# Patient Record
Sex: Female | Born: 1970 | Race: Black or African American | Hispanic: No | Marital: Married | Smoking: Former smoker
Health system: Southern US, Community
[De-identification: ages and names within clinical notes are randomized; demographics above are authoritative.]

## PROBLEM LIST (undated history)

## (undated) DIAGNOSIS — E7800 Pure hypercholesterolemia, unspecified: Secondary | ICD-10-CM

## (undated) DIAGNOSIS — J189 Pneumonia, unspecified organism: Secondary | ICD-10-CM

## (undated) DIAGNOSIS — I10 Essential (primary) hypertension: Secondary | ICD-10-CM

## (undated) DIAGNOSIS — A599 Trichomoniasis, unspecified: Secondary | ICD-10-CM

## (undated) DIAGNOSIS — J45909 Unspecified asthma, uncomplicated: Secondary | ICD-10-CM

## (undated) HISTORY — DX: Pure hypercholesterolemia, unspecified: E78.00

## (undated) HISTORY — PX: CHOLECYSTECTOMY: SHX55

## (undated) HISTORY — DX: Essential (primary) hypertension: I10

## (undated) HISTORY — DX: Trichomoniasis, unspecified: A59.9

---

## 1999-10-29 ENCOUNTER — Emergency Department (HOSPITAL_COMMUNITY): Admission: EM | Admit: 1999-10-29 | Discharge: 1999-10-29 | Payer: Self-pay | Admitting: Emergency Medicine

## 2000-04-05 ENCOUNTER — Encounter: Payer: Self-pay | Admitting: Obstetrics and Gynecology

## 2000-04-05 ENCOUNTER — Ambulatory Visit (HOSPITAL_COMMUNITY): Admission: RE | Admit: 2000-04-05 | Discharge: 2000-04-05 | Payer: Self-pay | Admitting: Obstetrics and Gynecology

## 2001-01-22 ENCOUNTER — Emergency Department (HOSPITAL_COMMUNITY): Admission: EM | Admit: 2001-01-22 | Discharge: 2001-01-22 | Payer: Self-pay | Admitting: Emergency Medicine

## 2001-03-29 ENCOUNTER — Emergency Department (HOSPITAL_COMMUNITY): Admission: EM | Admit: 2001-03-29 | Discharge: 2001-03-29 | Payer: Self-pay | Admitting: Emergency Medicine

## 2002-08-12 ENCOUNTER — Other Ambulatory Visit: Admission: RE | Admit: 2002-08-12 | Discharge: 2002-08-12 | Payer: Self-pay | Admitting: Obstetrics and Gynecology

## 2005-10-11 ENCOUNTER — Inpatient Hospital Stay (HOSPITAL_COMMUNITY): Admission: AD | Admit: 2005-10-11 | Discharge: 2005-12-28 | Payer: Self-pay | Admitting: Obstetrics and Gynecology

## 2005-10-20 ENCOUNTER — Ambulatory Visit: Payer: Self-pay | Admitting: Neonatology

## 2006-01-02 ENCOUNTER — Inpatient Hospital Stay (HOSPITAL_COMMUNITY): Admission: AD | Admit: 2006-01-02 | Discharge: 2006-01-20 | Payer: Self-pay | Admitting: Obstetrics and Gynecology

## 2006-01-17 ENCOUNTER — Encounter (INDEPENDENT_AMBULATORY_CARE_PROVIDER_SITE_OTHER): Payer: Self-pay | Admitting: *Deleted

## 2006-09-02 ENCOUNTER — Emergency Department (HOSPITAL_COMMUNITY): Admission: EM | Admit: 2006-09-02 | Discharge: 2006-09-02 | Payer: Self-pay | Admitting: Family Medicine

## 2006-11-26 ENCOUNTER — Emergency Department (HOSPITAL_COMMUNITY): Admission: EM | Admit: 2006-11-26 | Discharge: 2006-11-26 | Payer: Self-pay | Admitting: Family Medicine

## 2006-11-27 ENCOUNTER — Ambulatory Visit (HOSPITAL_COMMUNITY): Admission: RE | Admit: 2006-11-27 | Discharge: 2006-11-27 | Payer: Self-pay | Admitting: Family Medicine

## 2007-02-14 ENCOUNTER — Ambulatory Visit (HOSPITAL_COMMUNITY): Admission: RE | Admit: 2007-02-14 | Discharge: 2007-02-15 | Payer: Self-pay | Admitting: General Surgery

## 2007-02-14 ENCOUNTER — Encounter (INDEPENDENT_AMBULATORY_CARE_PROVIDER_SITE_OTHER): Payer: Self-pay | Admitting: *Deleted

## 2007-03-19 ENCOUNTER — Emergency Department (HOSPITAL_COMMUNITY): Admission: EM | Admit: 2007-03-19 | Discharge: 2007-03-19 | Payer: Self-pay | Admitting: Family Medicine

## 2007-06-10 IMAGING — US US OB FOLLOW-UP
1 series · 13 of 28 positions shown · non-contrast
Comparison: none

CLINICAL DATA: Pre-term labor.  Assess cervical length and estimated fetal weight.

[Series 1: us ob follow-up · 40 acquisitions, 13 frames shown]
[im 2/40]
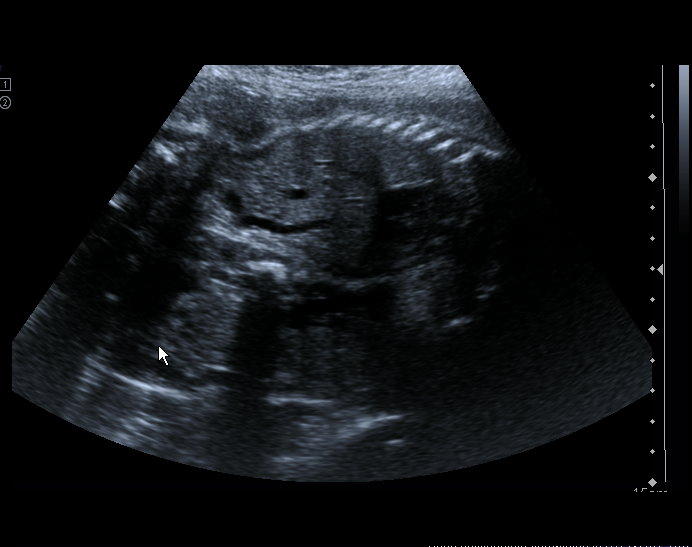
[im 5/40]
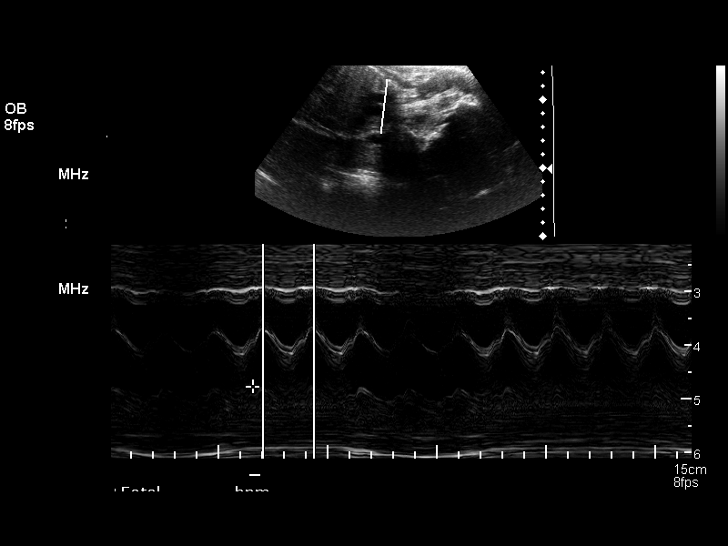
[im 8/40]
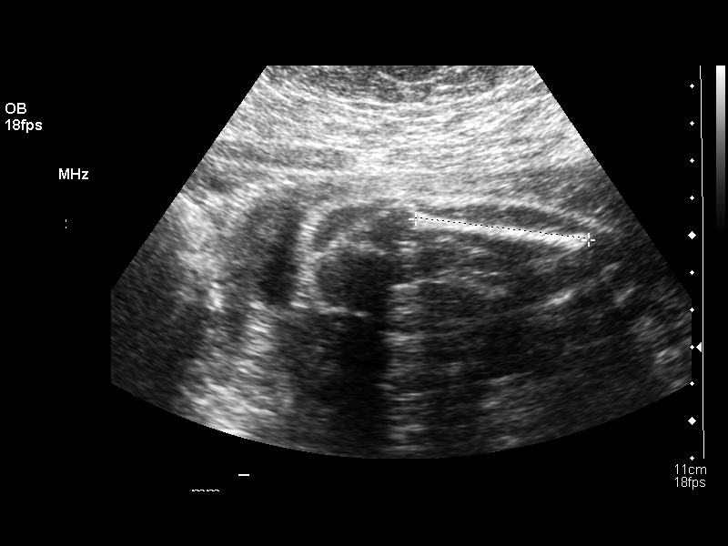
[im 11/40]
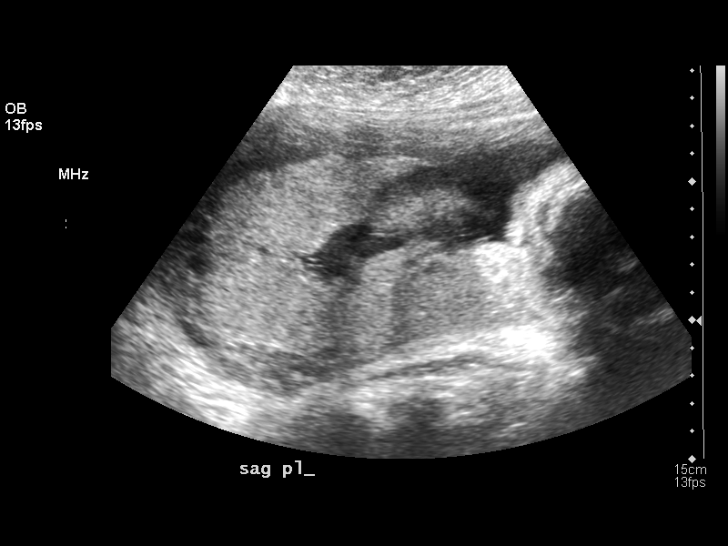
[im 14/40]
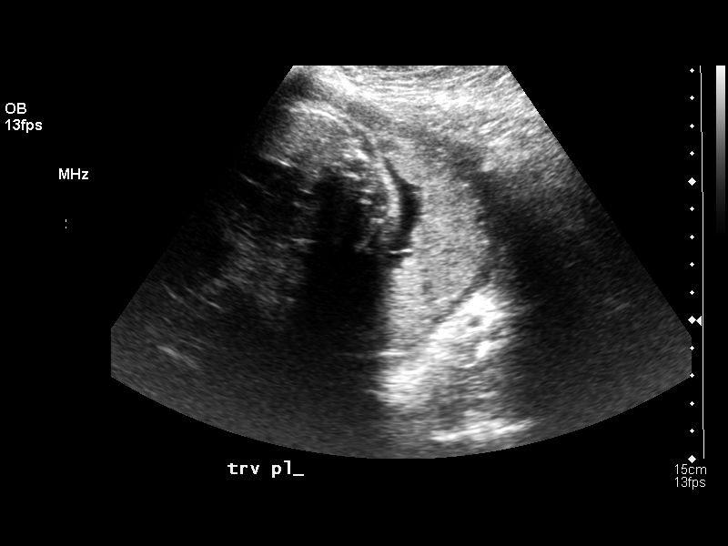
[im 16/40]
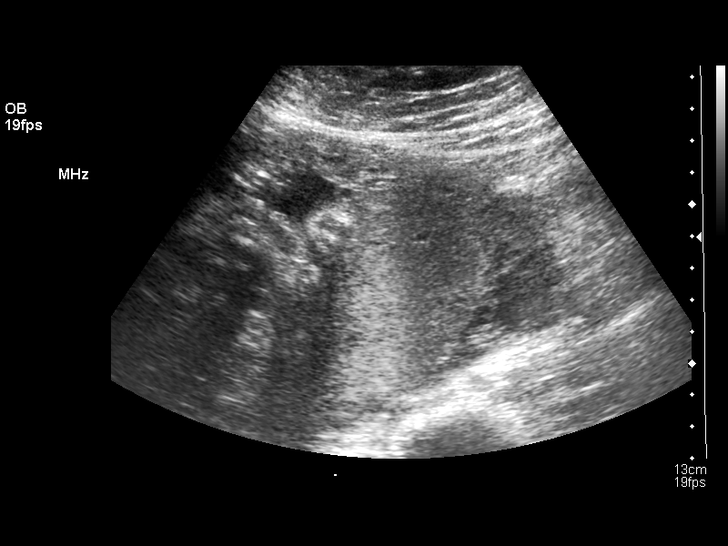
[im 21/40]
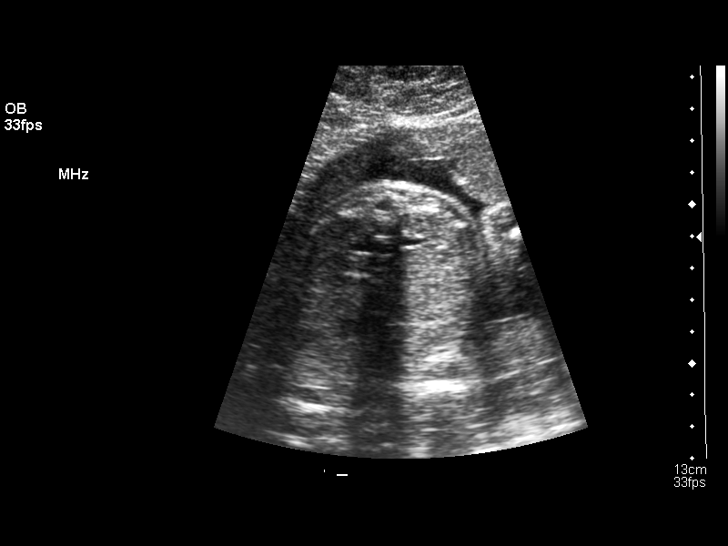
[im 24/40]
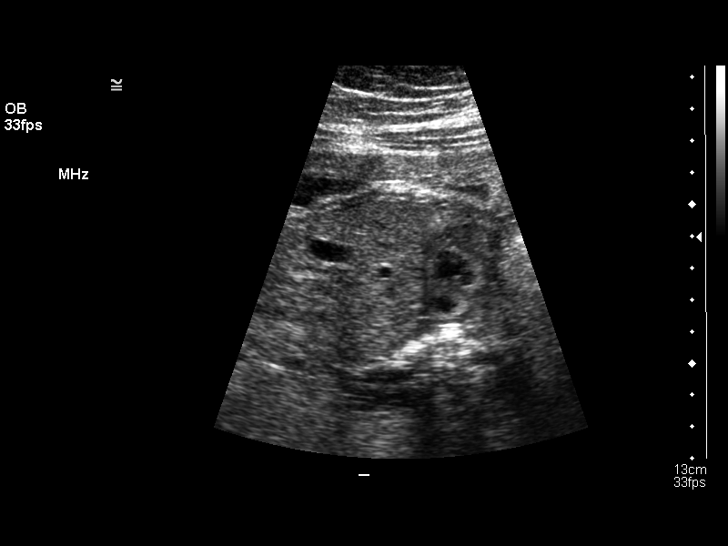
[im 27/40]
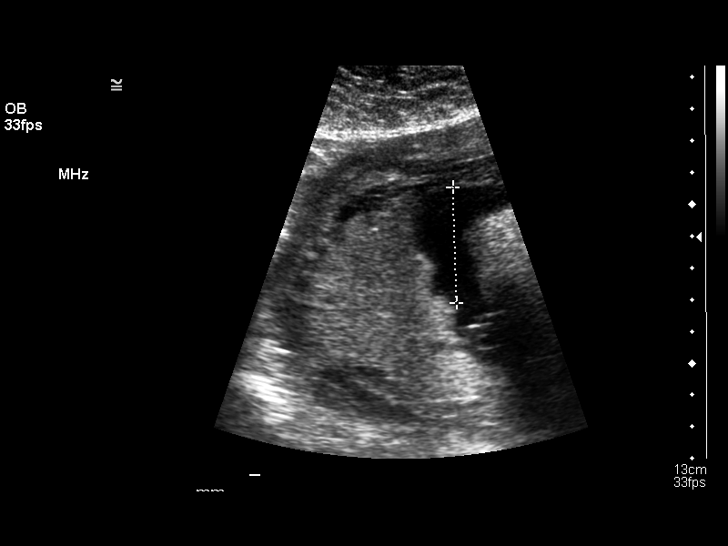
[im 29/40]
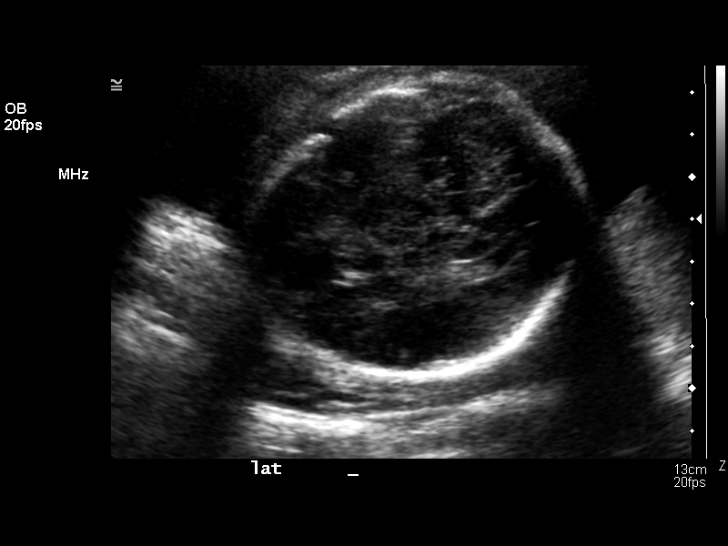
[im 32/40]
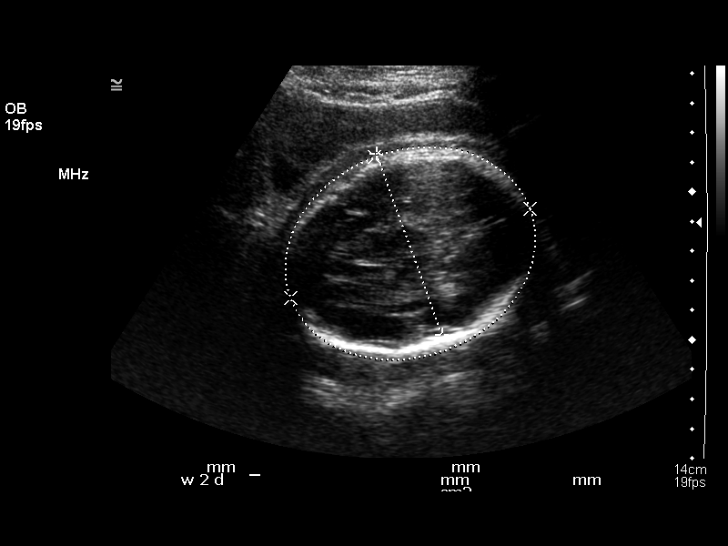
[im 35/40]
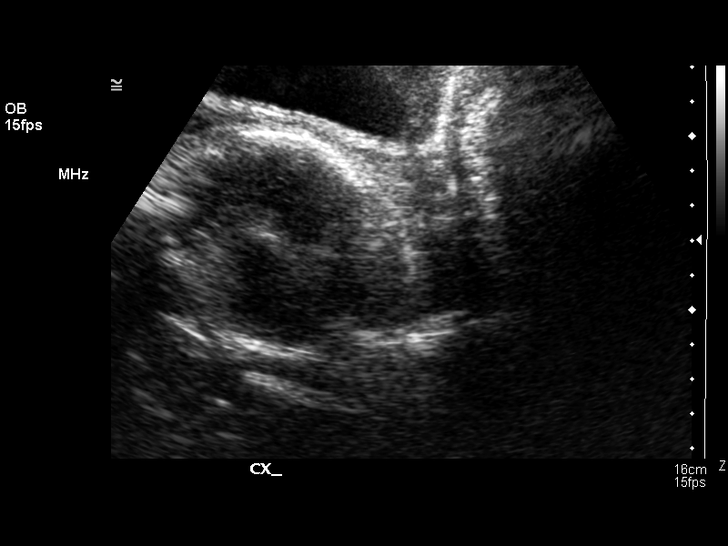
[im 38/40]
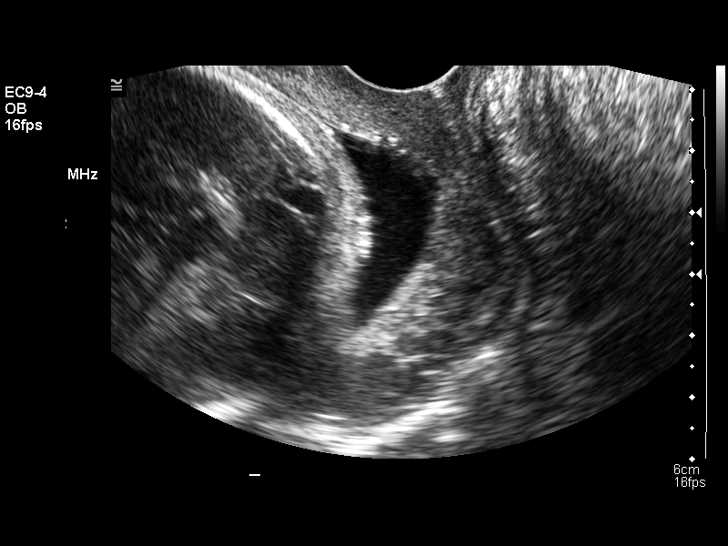

[13 of 28 positions shown; findings below may reference images not displayed]

OBSTETRICAL ULTRASOUND RE-EVALUATION AND TRANSVAGINAL OB US:
Number of Fetuses: 1
Heart Rate:  133
Movement:  Yes
Breathing:  Yes
Presentation:  Cephalic
Placental Location:  Fundal, posterior, left lateral 
Grade:  I
Previa:  No
Amniotic Fluid (subjective):  Normal
Amniotic Fluid (objective):  11.8 cm AFI (5th -95th%ile = 9.5 ? 22.6 cm for 27 wks)

FETAL BIOMETRY
BPD:  6.8 cm   27 w 2 d
HC:  24.8 cm  26 w 6 d
AC:  21.9 cm   26 w 2 d
FL:  4.9 cm  26 w 5 d

Mean GA:  26 w 6 d
Assigned GA:  26 w 6 d  Assigned EDC:  02/14/06

EFW:  951 g (H) 50th ? 75th%ile (875 ? 5854 g) For 27 wks

FETAL ANATOMY
Lateral Ventricles:  Visualized 
Thalami/CSP:  Previously seen 
Posterior Fossa:  Previously seen 
Nuchal Region:  N/A
Spine:  Previously seen 
4 Chamber Heart on Left:  Visualized 
Stomach on Left:  Visualized 
3 Vessel Cord:  2-vessel cord noted
Cord Insertion Site:  Previously seen 
Kidneys:  Visualized 
Bladder:  Visualized  
Extremities:  Previously seen 
MATERNAL UTERINE AND ADNEXAL FINDINGS
Cervix:  6 mm Transvaginally

BIOPHYSICAL PROFILE

Movement:  2    Time:  15 minutes
Breathing:  2
Tone:  2
Amniotic Fluid:  2

Total Score:  8
IMPRESSION: 1.  Single intrauterine pregnancy demonstrating an estimated gestational age by ultrasound of 26 weeks and 6 days.  Correlation with assigned gestational age of 26 weeks and 6 days suggests appropriate growth.  Currently the estimated fetal weight is just above the 50th percentile for a 27 week gestation.  
2.  Subjectively and quantitatively normal amniotic fluid volume.
3.  Biophysical profile score [DATE].
4.  Shortened cervical length today measuring 6 mm.  This has not changed significantly since the previous exam of 11/06/05 at which time it measured 8 mm.
5.  No late developing fetal anatomic abnormalities are identified associated with the lateral ventricles, four chamber heart, stomach, kidneys or bladder.  Again noted is the presence of a 2 vessel cord.

## 2007-06-28 IMAGING — US US OB TRANSVAGINAL MODIFY
1 series · 13 of 28 positions shown · non-contrast
Comparison: 11/24/05.

CLINICAL DATA: Preterm labor.  Decelerations.  

BIOPHYSICAL PROFILE AND TRANSVAGINAL OB US:

[Series 1: us ob transvaginal modify · 0.24mm/px · 13 of 41 slices shown]
[im 2/41]
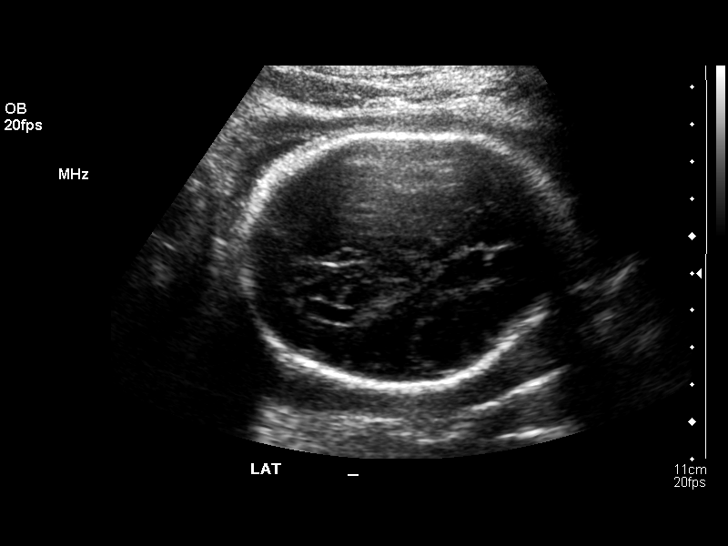
[im 5/41]
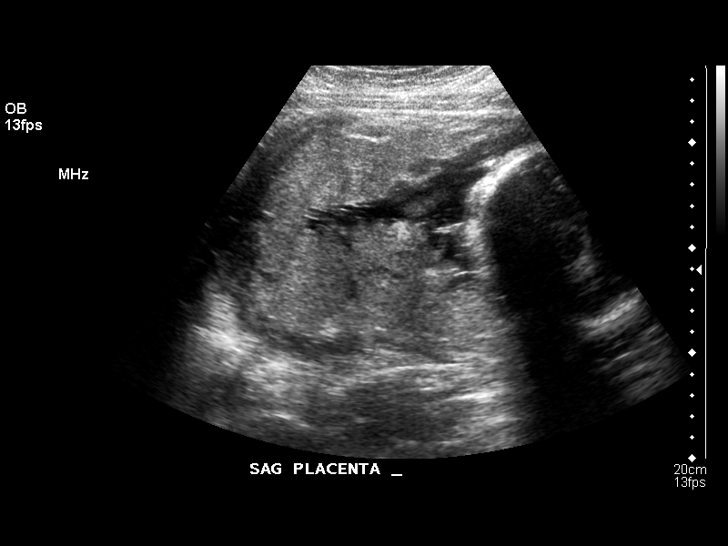
[im 8/41]
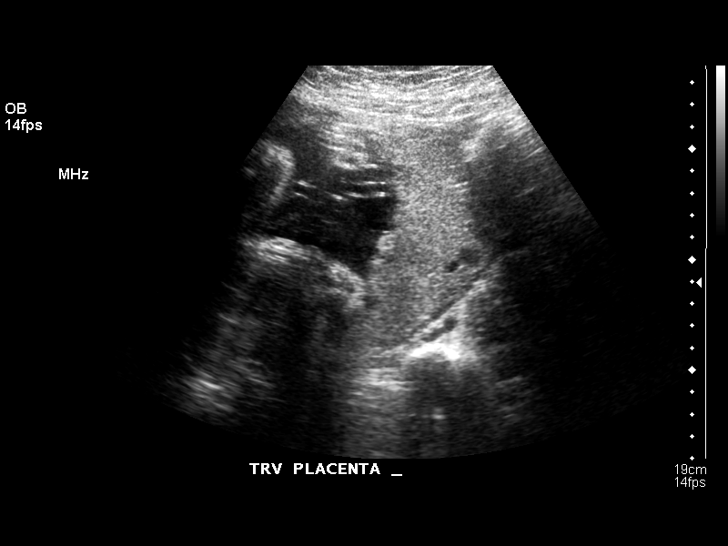
[im 11/41]
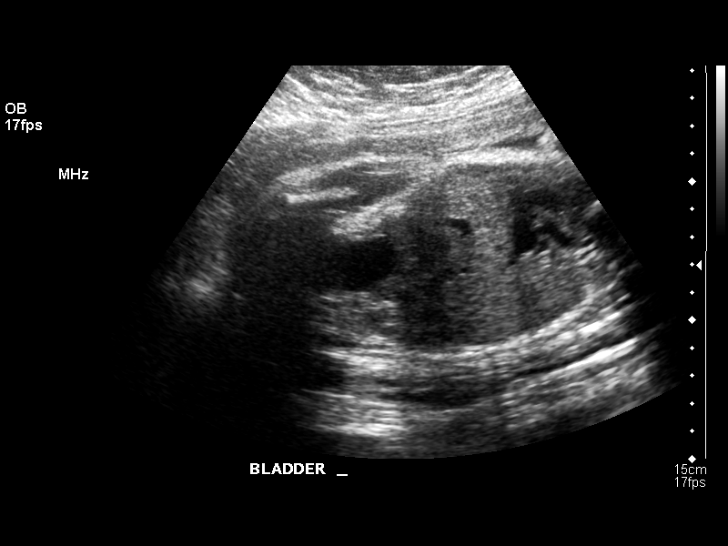
[im 14/41]
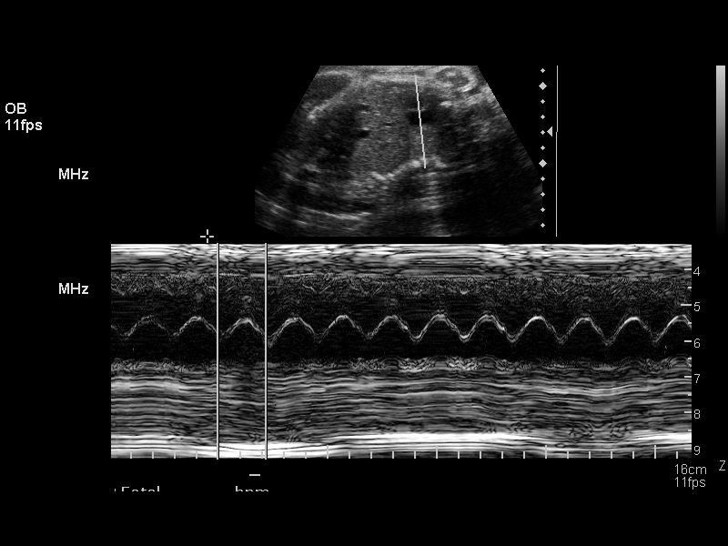
[im 17/41]
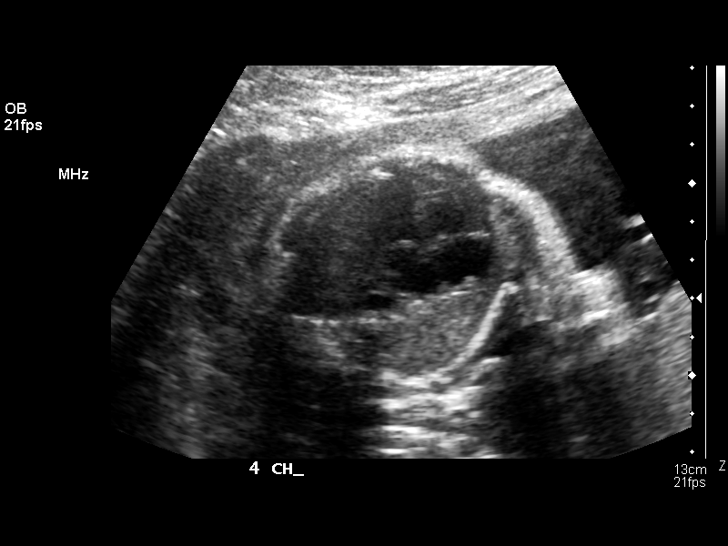
[im 21/41]
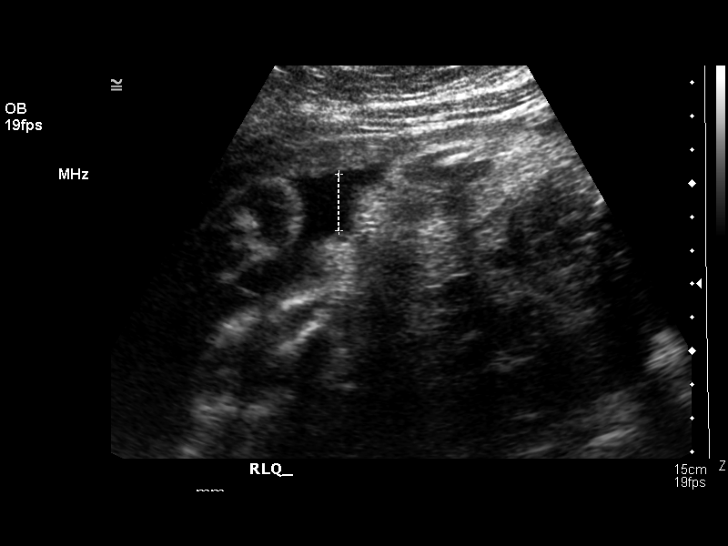
[im 24/41]
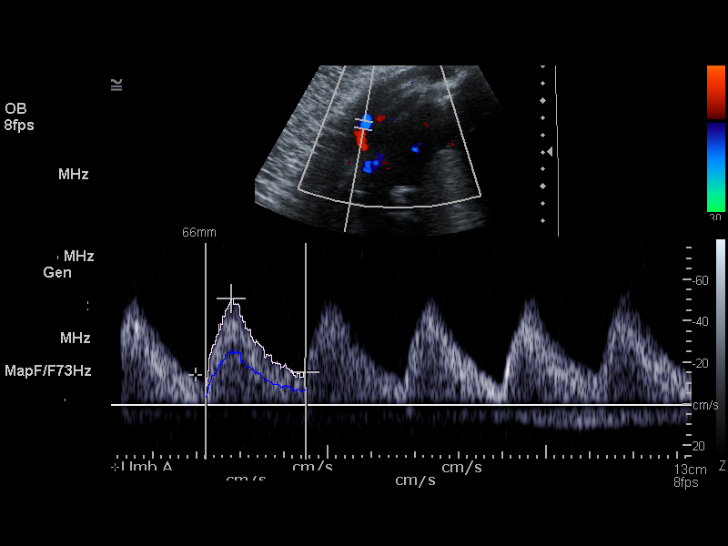
[im 27/41]
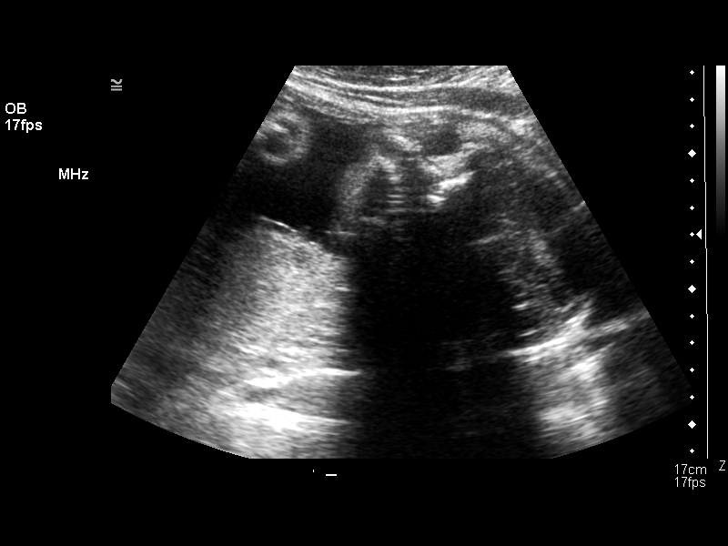
[im 30/41]
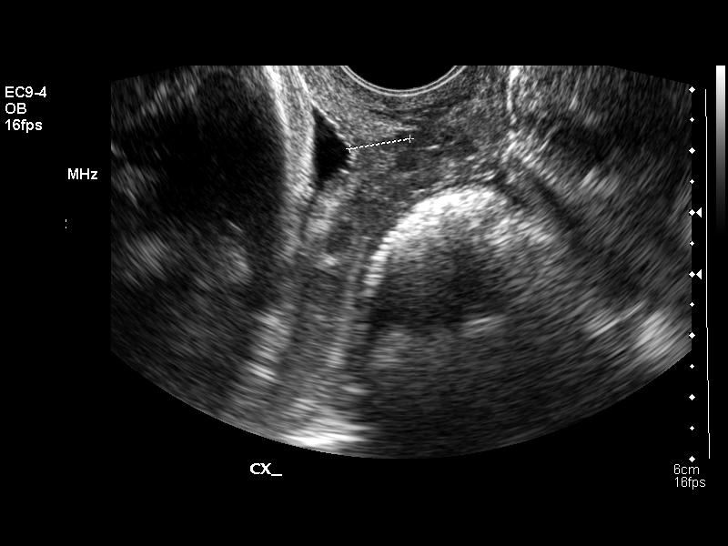
[im 33/41]
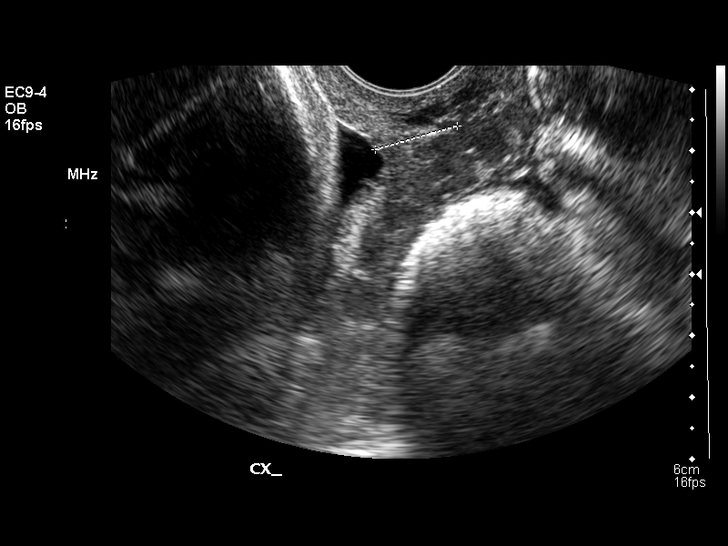
[im 36/41]
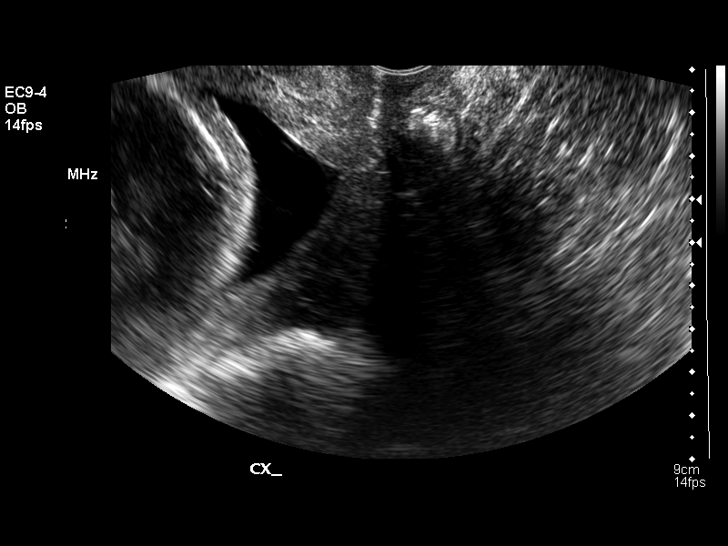
[im 39/41]
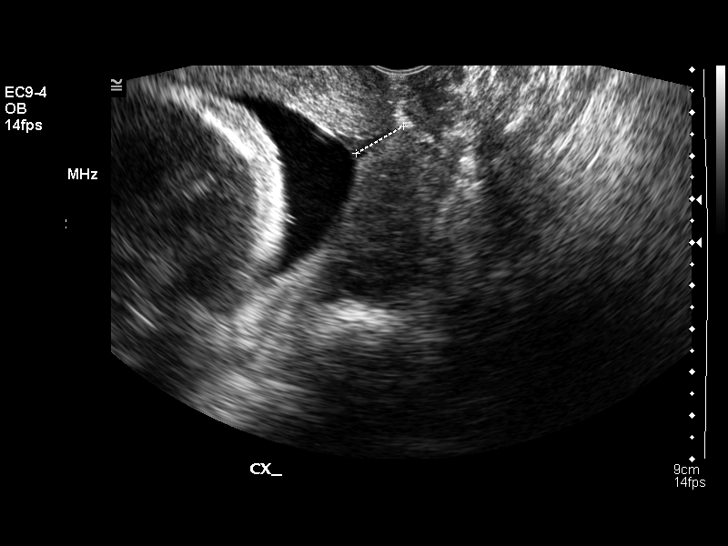

[13 of 28 positions shown; findings below may reference images not displayed]

Number of Fetuses:  1
Heart rate:  136
Movement:  Yes
Breathing:  Yes
Presentation:  Cephalic
Placental Location:  Fundal, posterior, and left lateral
Grade:  I
Previa:  No
Amniotic Fluid (Subjective):  Normal
Amniotic Fluid (Objective):  11.4 cm AFI (5th -95th%ile = 9.2 ? 23.1 cm for 29 wks)

Fetal measurements and complete anatomic evaluation were not requested.  The following fetal anatomy was visualized on this exam:  Lateral ventricles, thalami, four chamber heart, stomach, kidneys, bladder, LVOT, orbits and diaphragm.  2-vessel cord previously noted. 

BPP SCORING
Movements:  2  Time:  30 minutes
Breathing:  2
Tone:  2
Amniotic Fluid:  2
Total Score:  8

MATERNAL UTERINE AND ADNEXAL FINDINGS
Cervix:  1.3 cm Transvaginally again noted.

DOPPLER ULTRASOUND OF FETUS:

Umbilical Artery S/D Ratio:  3.40    (NL< 4.35)
IMPRESSION: 1.  Single live intrauterine gestation in cephalic presentation.  
2.  Normal umbilical cord Doppler.  
3.  Biophysical profile [DATE] over 30 minutes.  
4.  2-vessel cord again incidentally noted.    

5. Cervical shortening (1.3 cm) again noted.

## 2007-07-02 IMAGING — US US UA DOPPLER RE-EVAL
1 series · 13 of 28 positions shown · non-contrast
Comparison: none

CLINICAL DATA: Fetal decels; preterm labor; assigned gestational age is 30 weeks.

[Series 1: us ua doppler re-eval · 0.31mm/px · 13 of 34 slices shown]
[im 2/34]
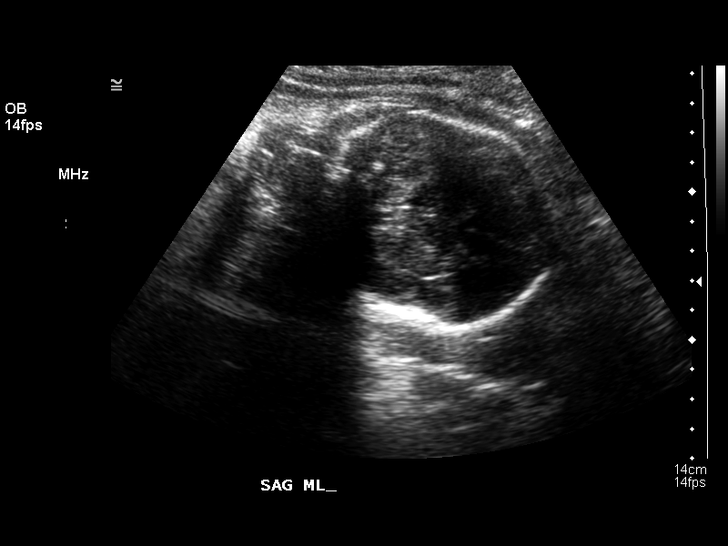
[im 4/34]
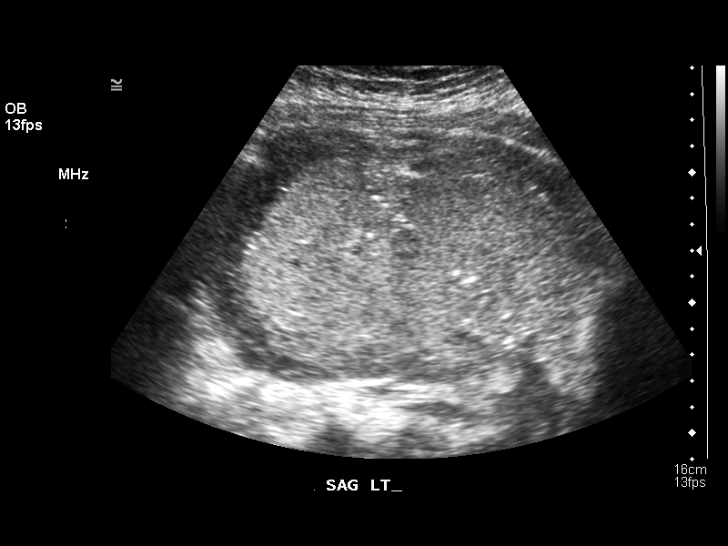
[im 7/34]
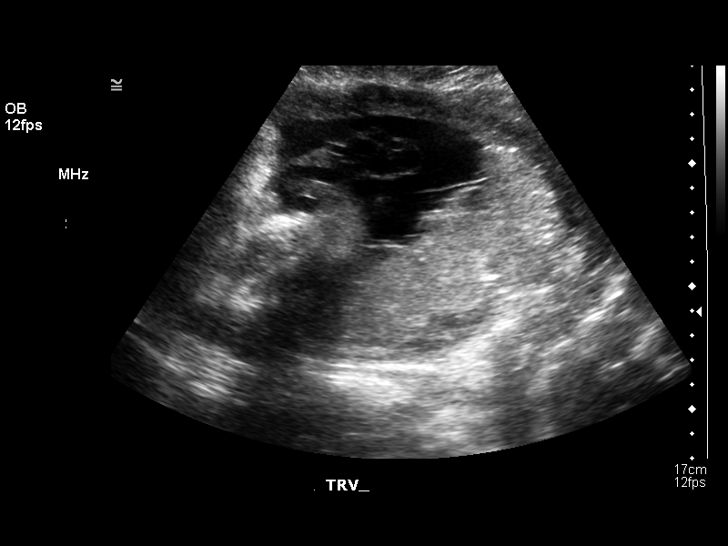
[im 9/34]
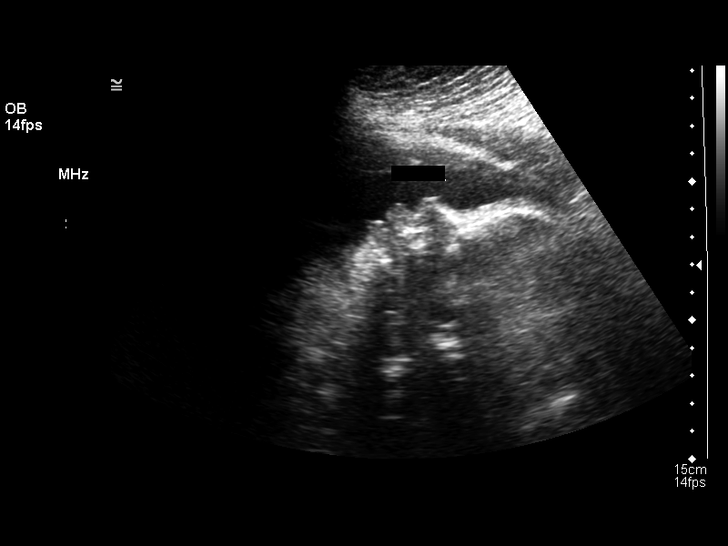
[im 12/34]
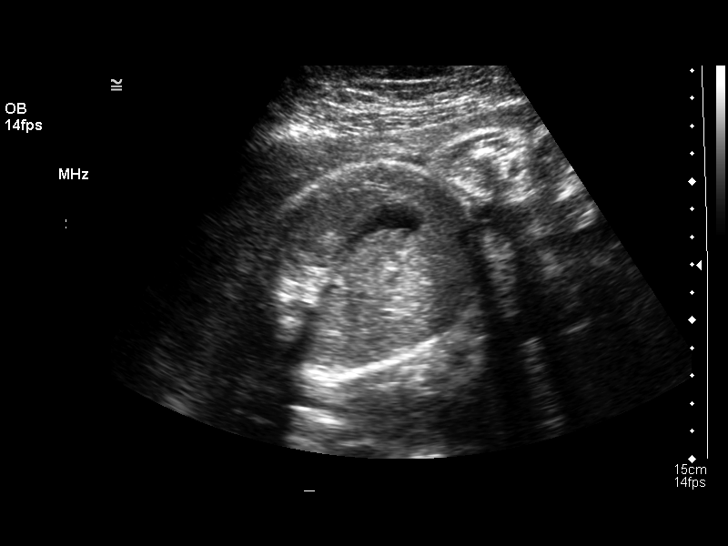
[im 14/34]
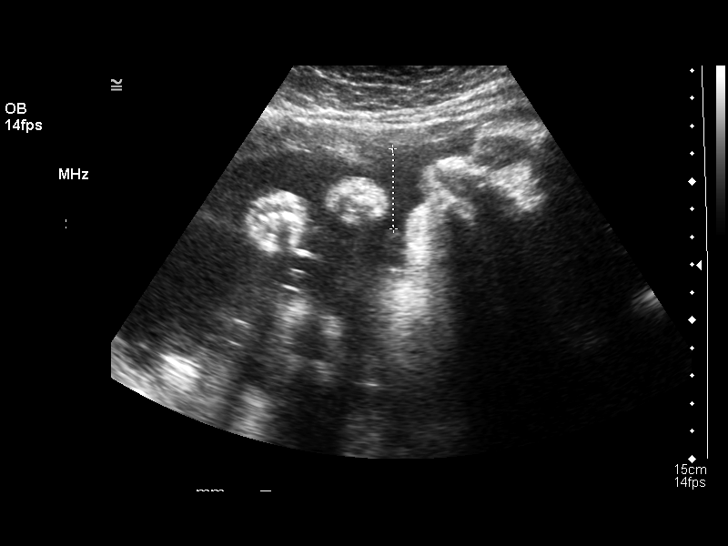
[im 18/34]
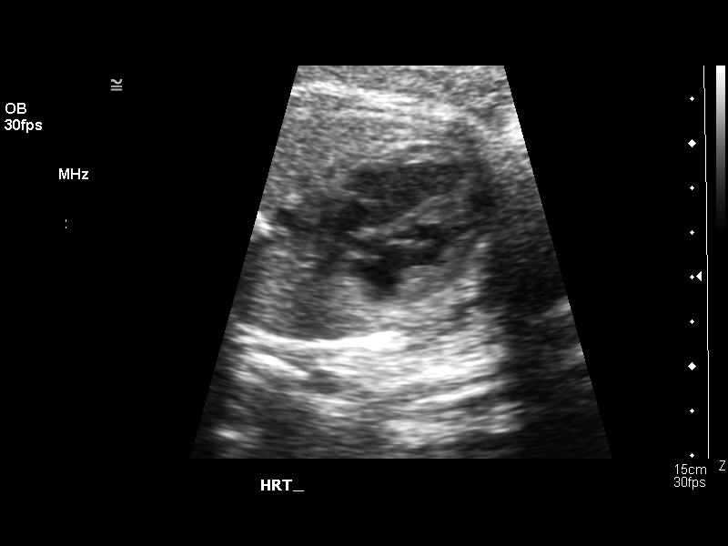
[im 20/34]
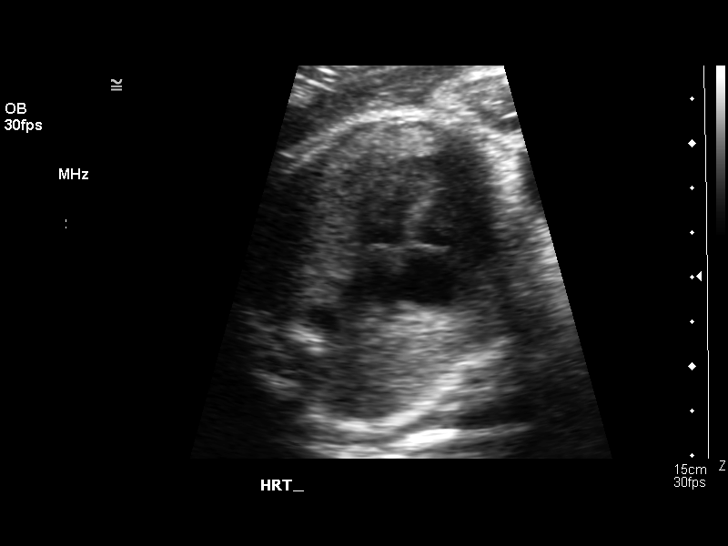
[im 23/34]
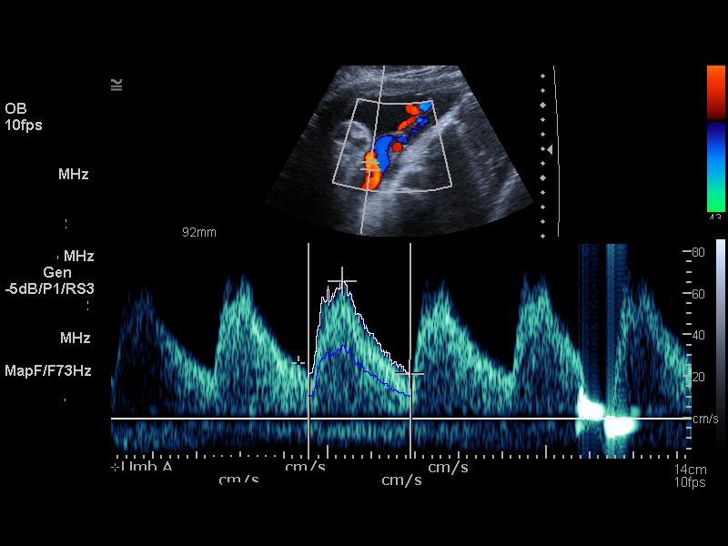
[im 25/34]
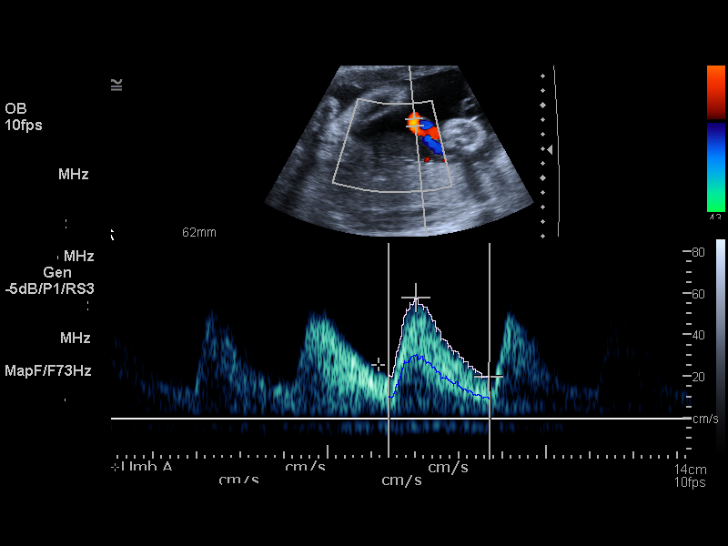
[im 27/34]
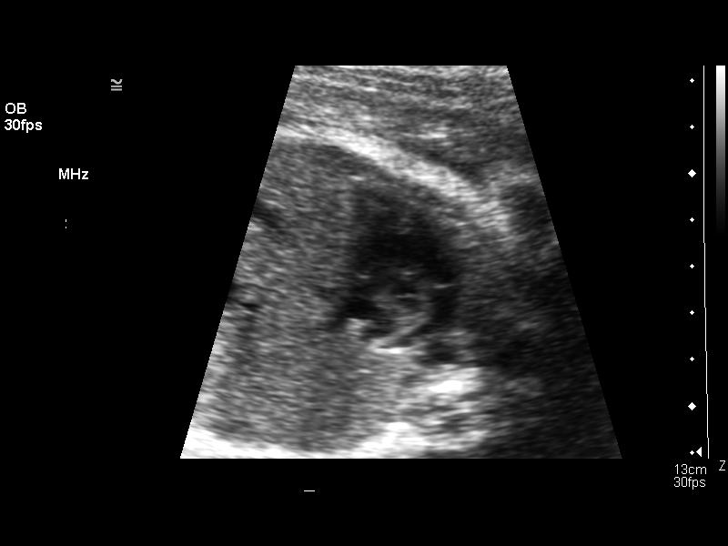
[im 30/34]
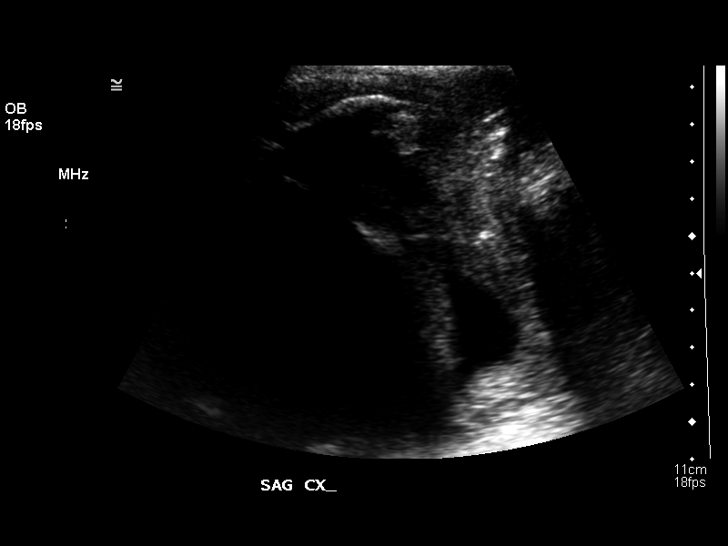
[im 32/34]
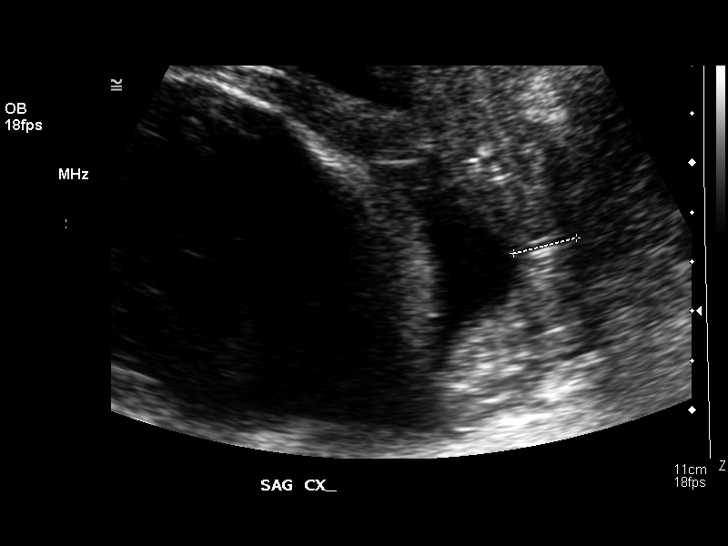

[13 of 28 positions shown; findings below may reference images not displayed]

BIOPHYSICAL PROFILE

Number of Fetuses:  1
Heart rate:  136
Movement:  Yes
Breathing:  Yes
Presentation:  Cephalic
Placental Location:  Fundal, posterior, and left lateral 
Grade:  I
Previa:  No
Amniotic Fluid (Subjective):  Normal
Amniotic Fluid (Objective):  11.4 cm AFI (5th -95th%ile = 9.2 ? 23.1 cm for 29 wks)

Fetal measurements and complete anatomic evaluation were not requested.  The following fetal anatomy was visualized on this exam:  Lateral ventricles, thalami, four chamber heart, stomach, kidneys, bladder, LVOT, orbits, and diaphragm.

BPP SCORING
Movements:  2  Time:  30 minutes
Breathing:  2
Tone:  2
Amniotic Fluid:  2
Total Score:  8

MATERNAL UTERINE AND ADNEXAL FINDINGS
Cervix:  1.3 cm Transvaginally

DOPPLER ULTRASOUND OF FETUS:

Umbilical Artery S/D Ratio:  3.40    (NL< 4.35)
IMPRESSION: 1.  There is a single living intrauterine gestation in cephalic presentation.  The biophysical score is [DATE] over a 20 minute period.  The umbilical artery Doppler examination is normal. 
2.  The cervix remains shortened, measuring approximately 1.2 cm, translabially.  The internal os is dilated to approximately 2.8 cm in the anterior/posterior dimension.

## 2007-07-05 ENCOUNTER — Emergency Department (HOSPITAL_COMMUNITY): Admission: EM | Admit: 2007-07-05 | Discharge: 2007-07-05 | Payer: Self-pay | Admitting: Emergency Medicine

## 2007-07-05 IMAGING — US US FETAL BPP W/O NONSTRESS
1 series · 14 of 28 positions shown · non-contrast
Comparison: none

CLINICAL DATA: 30 week 3 day assigned gestational age.  Known 2-vessel umbilical cord.  Preterm labor.  Evaluate BPP and umbilical artery Doppler.

[Series 1: us fetal bpp w/o nonstress · 14 of 28 slices shown]
[im 2/28]
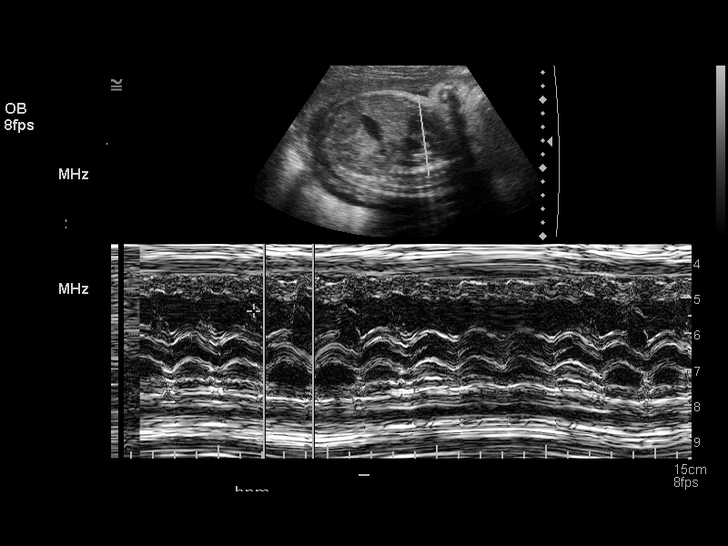
[im 4/28]
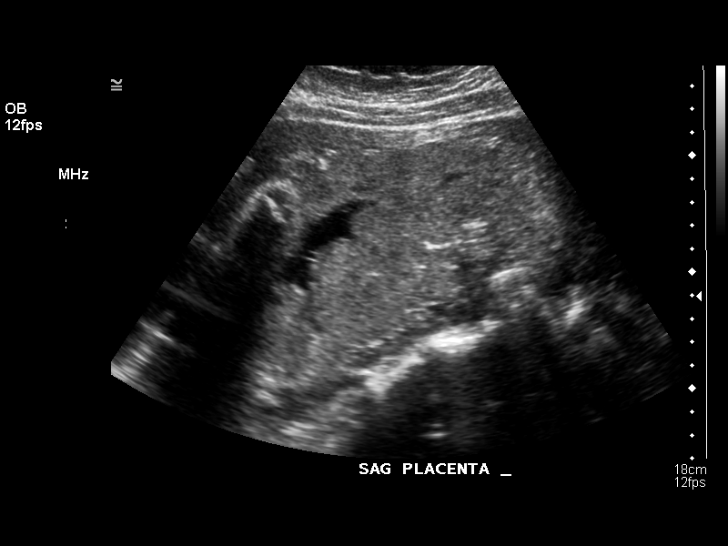
[im 6/28]
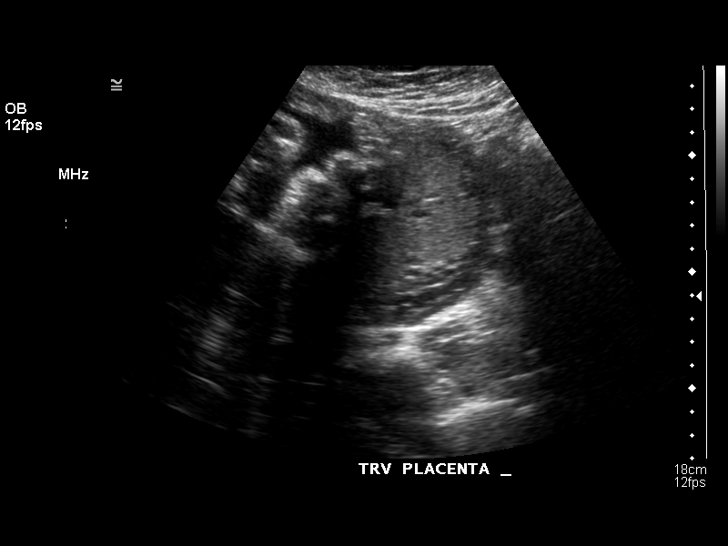
[im 8/28]
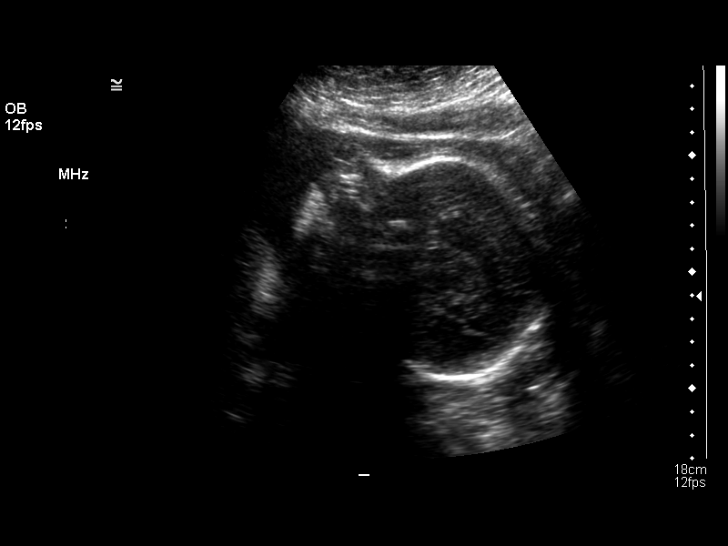
[im 10/28]
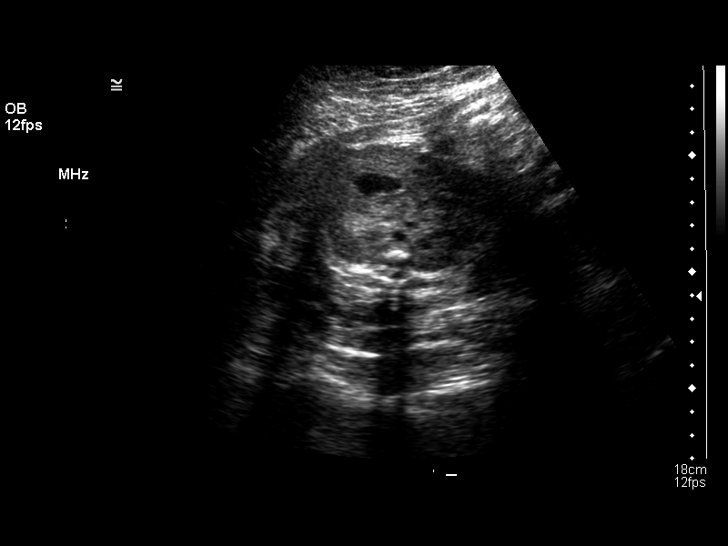
[im 12/28]
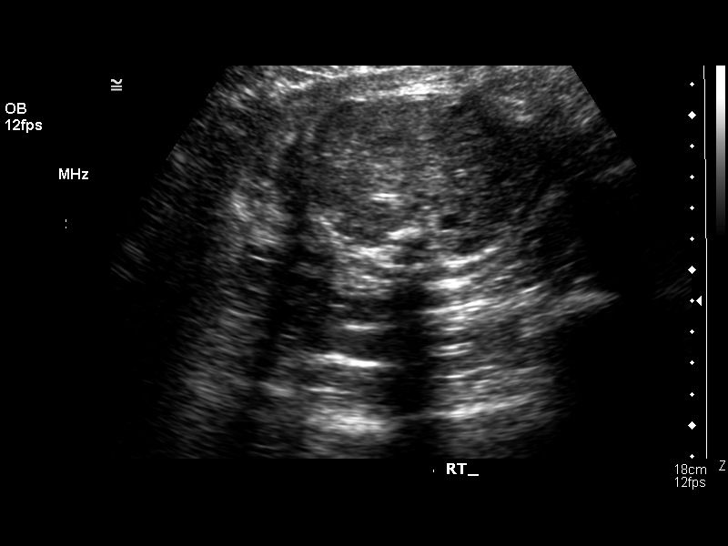
[im 14/28]
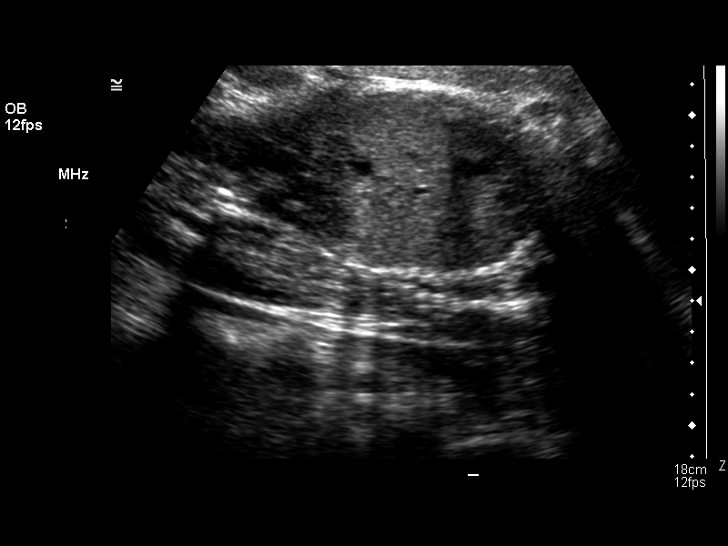
[im 16/28]
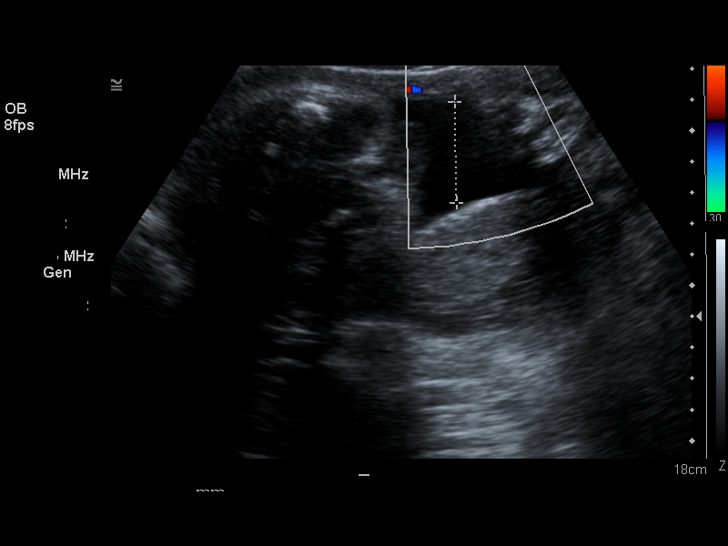
[im 18/28]
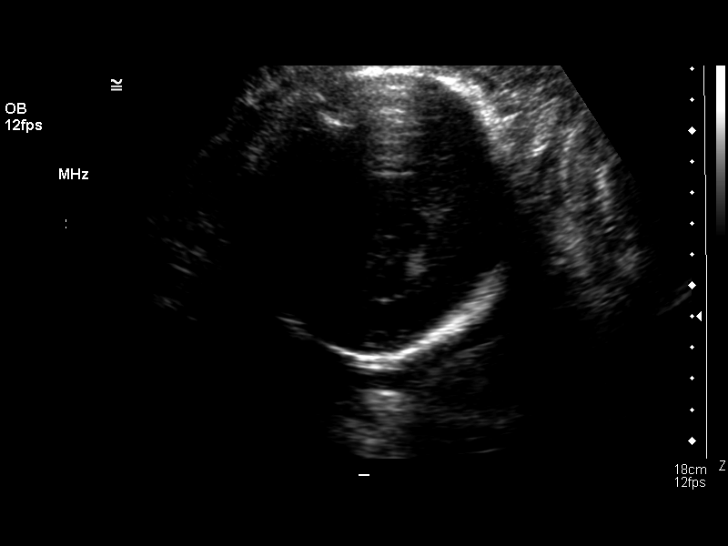
[im 20/28]
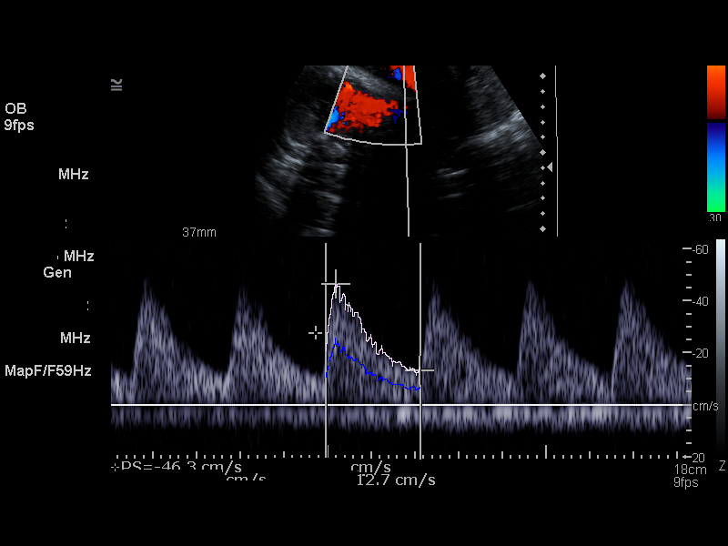
[im 22/28]
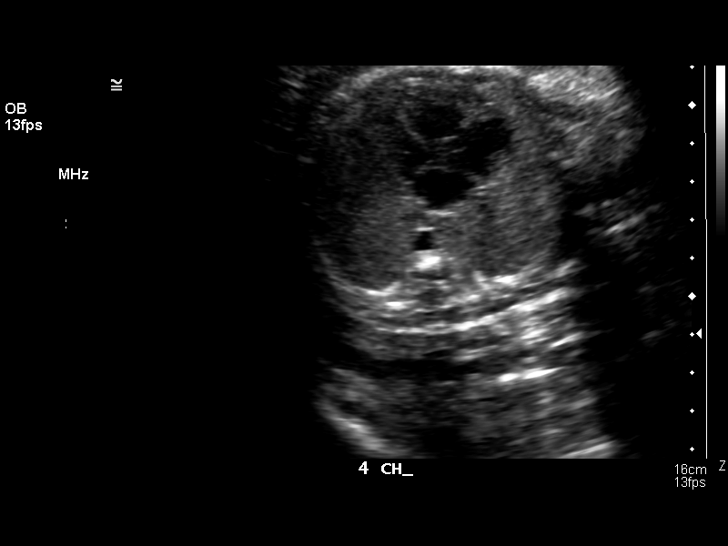
[im 24/28]
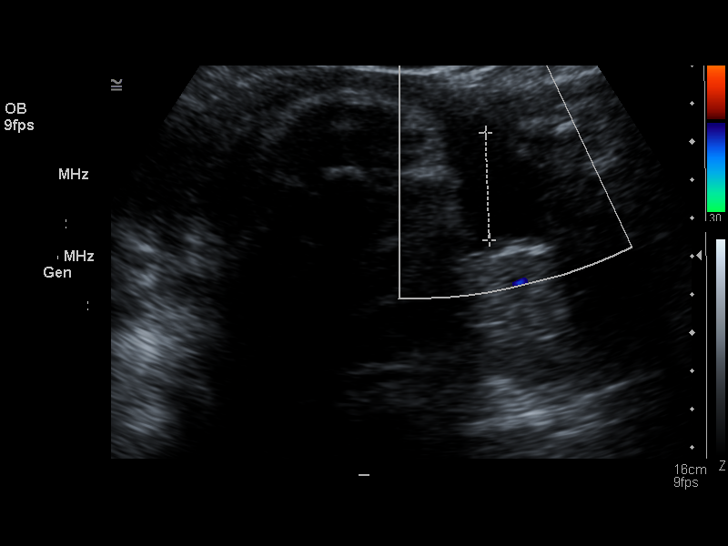
[im 26/28]
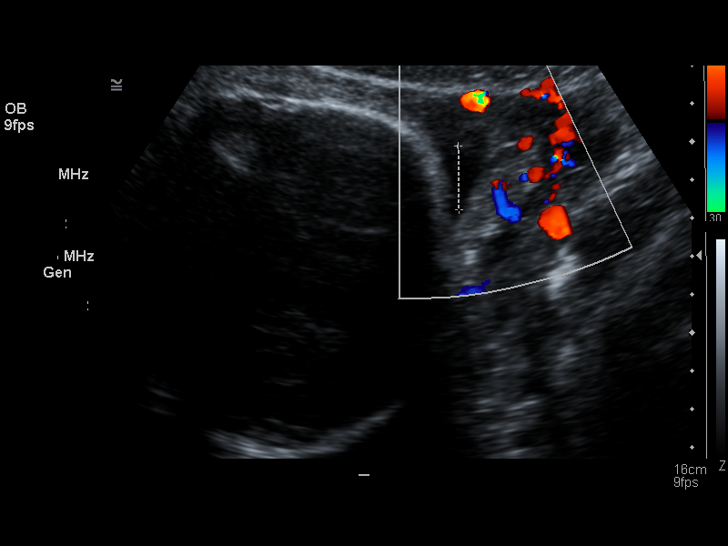
[im 28/28]
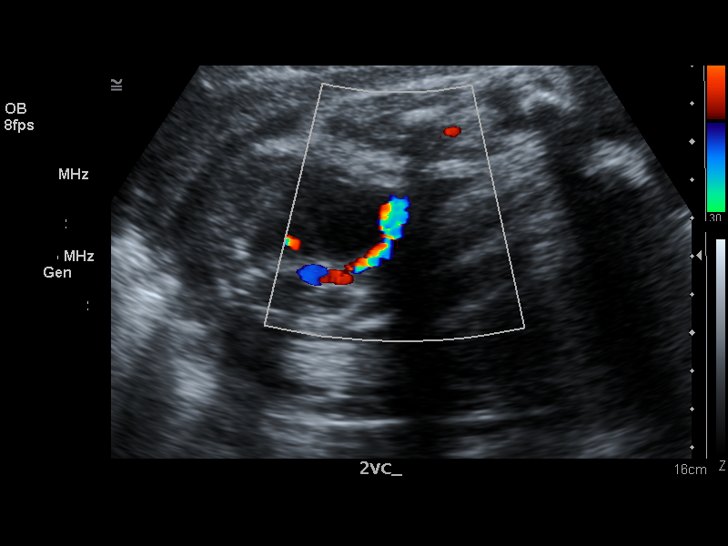

[14 of 28 positions shown; findings below may reference images not displayed]

BIOPHYSICAL PROFILE

 Number of Fetuses:  1
 Heart rate:  133
 Movement:  Yes
 Breathing:  No
 Presentation:  Cephalic
 Placental Location:  Posterior, left lateral 
 Grade:  II
 Previa:  No
 Amniotic Fluid (Subjective):  Low normal
 Amniotic Fluid (Objective):  10.1 cm AFI (5th -95th%ile = 9.0 ? 23.4 cm for 30 wks)

 Fetal measurements and complete anatomic evaluation were not requested.  The following fetal anatomy was visualized on this exam:  Four chamber heart, stomach, 2-vessel cord, kidneys, bladder, diaphragm and male genitalia.

 BPP SCORING 
 Movements:  2  Time:  30 minutes
 Breathing:  0
 Tone:  2
 Amniotic Fluid:  2
 Total Score:  6

 MATERNAL UTERINE AND ADNEXAL FINDINGS
 Cervix:  Not evaluated.
IMPRESSION: 1.  Biophysical profile score [DATE].
 2.  Low normal amniotic fluid volume noted, with AFI of 10.1 cm on today?s exam.
 DOPPLER ULTRASOUND OF FETUS:

 Umbilical Artery S/D Ratio:  3.37    (NL< 4.17)
IMPRESSION: Umbilical artery S/D ratio is within normal limits for gestational age.

## 2008-09-22 ENCOUNTER — Encounter: Admission: RE | Admit: 2008-09-22 | Discharge: 2008-10-06 | Payer: Self-pay | Admitting: Emergency Medicine

## 2008-10-12 ENCOUNTER — Emergency Department (HOSPITAL_COMMUNITY): Admission: EM | Admit: 2008-10-12 | Discharge: 2008-10-12 | Payer: Self-pay | Admitting: Emergency Medicine

## 2009-09-03 ENCOUNTER — Other Ambulatory Visit: Admission: RE | Admit: 2009-09-03 | Discharge: 2009-09-03 | Payer: Self-pay | Admitting: Gynecology

## 2009-09-03 ENCOUNTER — Encounter: Payer: Self-pay | Admitting: Gynecology

## 2009-09-03 ENCOUNTER — Ambulatory Visit: Payer: Self-pay | Admitting: Gynecology

## 2009-09-14 ENCOUNTER — Ambulatory Visit: Payer: Self-pay | Admitting: Gynecology

## 2010-03-20 ENCOUNTER — Emergency Department (HOSPITAL_COMMUNITY): Admission: EM | Admit: 2010-03-20 | Discharge: 2010-03-21 | Payer: Self-pay | Admitting: Emergency Medicine

## 2010-12-08 ENCOUNTER — Other Ambulatory Visit
Admission: RE | Admit: 2010-12-08 | Discharge: 2010-12-08 | Payer: Self-pay | Source: Home / Self Care | Admitting: Gynecology

## 2010-12-08 ENCOUNTER — Ambulatory Visit
Admission: RE | Admit: 2010-12-08 | Discharge: 2010-12-08 | Payer: Self-pay | Source: Home / Self Care | Attending: Gynecology | Admitting: Gynecology

## 2010-12-26 ENCOUNTER — Encounter: Payer: Self-pay | Admitting: Obstetrics and Gynecology

## 2011-04-14 ENCOUNTER — Emergency Department (HOSPITAL_COMMUNITY)
Admission: EM | Admit: 2011-04-14 | Discharge: 2011-04-14 | Disposition: A | Discharge status: Home / Self Care | Payer: No Typology Code available for payment source | Attending: Emergency Medicine | Admitting: Emergency Medicine

## 2011-04-14 DIAGNOSIS — T148XXA Other injury of unspecified body region, initial encounter: Secondary | ICD-10-CM | POA: Insufficient documentation

## 2011-04-14 DIAGNOSIS — M542 Cervicalgia: Secondary | ICD-10-CM | POA: Insufficient documentation

## 2011-04-14 DIAGNOSIS — E119 Type 2 diabetes mellitus without complications: Secondary | ICD-10-CM | POA: Insufficient documentation

## 2011-04-14 DIAGNOSIS — M25519 Pain in unspecified shoulder: Secondary | ICD-10-CM | POA: Insufficient documentation

## 2011-04-22 NOTE — Discharge Summary (Signed)
NAME:  Bailey Stevenson, Bailey Stevenson            ACCOUNT NO.:  [id number]   MEDICAL RECORD NO.:  [id number]          PATIENT TYPE:  INP   LOCATION:  9154                          FACILITY:  [facility]   PHYSICIAN:  Richardean Sale, M.D.   DATE OF BIRTH:  [birth year]   DATE OF ADMISSION:  10/11/2005  DATE OF DISCHARGE:                                 DISCHARGE SUMMARY   ADMISSION DIAGNOSES:  1.  23-week intrauterine pregnancy with short cervix.  2.  Insulin-dependent diabetes.  3.  Fetus with 2-vessel umbilical cord.   DISCHARGE DIAGNOSES:  1.  Insulin-dependent diabetes.  2.  Fetus with 2-vessel umbilical cord.  3.  Gestational age at discharge 33 weeks and 1 day.   CONSULTATIONS:  1.  Dr. Ander Slade with [facility].  2.  [facility] NICU Team.  3.  Dr. Dalene Seltzer for fetal echocardiogram.  4.  Diabetes Management Team   PROCEDURE:  1.  Serial fetal ultrasound for growth.  2.  Complete anatomic fetal survey.  3.  Fetal echocardiogram.  4.  Daily fetal monitoring.   HOSPITAL COURSE:   HISTORY OF PRESENT ILLNESS:  Please see admission history and physical for  details. Briefly, this is a 40 year old gravida 1, para 0, African-American  female who presented to the office in the second trimester with an unsure  LMP. The patient underwent an ultrasound to establish estimated due date and  gestational age and was found to have an approximately 23-weeks size fetus  with a short cervix of only 7 mm. The patient was subsequently hospitalized  for hospital bed rest. In addition the patient had a history of non-insulin-  dependent diabetes. On admission to the hospital her hemoglobin A1C was  elevated at 7.7. She was started on Insulin for control of diabetes. The  nutrition management and diabetes team was consulted throughout her  hospitalization. Insulin was adjusted accordingly until adequate control was  achieved. The patient remained stable on hospital bedrest, her cervix  remained unchanged. She did receive betamethasone at approximately [redacted] weeks  gestation, given her early gestational age and short cervix. The patient  underwent serial ultrasounds for growth which were appropriate throughout  her hospitalization. In addition she had a 2D fetal echocardiogram performed  by Dr. Elizebeth Brooking which was negative for any cardiac abnormality. A complete  anatomic survey was within normal limits as well, except for a 2-vessel  umbilical cord. Fetal heart rate tracing during the early third trimester  was overall reassuring with the exception of sporadic decelerations. The  patient remained on continuous fetal monitoring throughout her  hospitalization up until 32 weeks when these fetal heart rate decelerations  resolved. In addition patient was started on Procardia for preterm  contractions and given her short cervix. At the time of discharge patient's  blood sugars are showing improved control. The fetal heart rate tracing is  reactive with no further decelerations. Her cervix is unchanged at 1 cm  dilated, 70% effaced, -1 station and she is having no contractions. The  patient was subsequently discharged to home on home bed rest and pelvic rest  with daily  fetal movement counts and continued administration of Insulin as  already scheduled. The patient agrees to follow up in 48 hours for a repeat  NST, and will plan for NSTs twice weekly with amniocentesis at approximately  37 weeks, and delivery if mature.   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 Hemoglobin A1C on October 15, 2005 was 7.7, repeat  on November 23, 2005 was 6.3. TSH within normal limits. Prenatal labs  including hepatitis B surface antigen, HIV, RPR nonreactive. Blood type is B  positive, negative antibody screen normal. Hemoglobin electrophoresis, GC,  Chlamydia negative.  Group B beta strep is positive. Rubella is immune. Fetal  fibronectin was negative on November 23, 2005. Last ultrasound on December 27, 2005:  Estimated fetal weight is 1915 grams with is the 15th percentile  for gestational age. Amniotic fluid index is normal at 17, position is  cephalic, placenta is posterior, grade 2 with no evidence of placenta  previa. Biophysical profile is 8/8 with normal umbilical artery doppler.  Last cervical length is 1.2 cm on December 20, 2005.   DISPOSITION:  To home. Condition stable.   FOLLOW UP:  The patient will follow up in 48 hours for an NST and agrees to  twice weekly fetal monitoring and twice weekly office visits. In addition  she will remain on home bed rest with pelvic rest and perform daily fetal  movement counts to assess fetal well being.   MEDICATIONS:  1.  NovoLog 20 units SQ given with each meal.  2.  NPH Insulin 22 units SQ q.a.m. and 30 units SQ q.h.s. The patient is to      record her fasting blood sugars and 2 hour postprandial's daily and call      for blood sugars greater than 200.  3.  Procardia 10 mg p.o. q.6h.      Richardean Sale, M.D.  Electronically Signed     JW/MEDQ  D:  12/28/2005  T:  12/28/2005  Job:  [id number]

## 2011-04-22 NOTE — Op Note (Signed)
NAME:  Bailey Stevenson, Bailey Stevenson            ACCOUNT NO.:  [id number]   MEDICAL RECORD NO.:  [id number]          PATIENT TYPE:  OIB   LOCATION:  1617                         FACILITY:  [facility]   PHYSICIAN:  Anselm Pancoast. Weatherly, M.D.DATE OF BIRTH:  24-Jan-1971   DATE OF PROCEDURE:  02/14/2007  DATE OF DISCHARGE:  02/15/2007                               OPERATIVE REPORT   PREOPERATIVE DIAGNOSIS:  Chronic cholecystitis with stones.   OPERATIONS:  Laparoscopic cholecystectomy with cholangiogram.   SURGEON:  Anselm Pancoast. Zachery Dakins, M.D.   ASSISTANT:  Wilmon Arms. Tsuei, M.D.   HISTORY:  Bailey Stevenson is a 40 year old female who was referred to  me by [facility] where she was seen in December with an episode of  epigastric pain which she describes usually occurs 4 to 5 hours after  eating.  She was seen in the [facility].  They referred  information to Dr. Wenda Low who was on call for [facility] at the time.  She had a mildly elevated liver function studies and stone impacted in  the neck of her gallbladder.  I saw her about 2 months later at which  time she was not actually having any acute symptoms and she is here  today for laparoscopic cholecystectomy and cholangiogram.  She is not  allergic to any antibiotics, listed shellfish and eggs, and has a  history intermittently with breathing problems.  On preoperative repeat  liver function studies, her liver function studies were mildly abnormal  and we will definitely do a cholangiogram.  The patient preoperatively  was given 3 grams of Unasyn and she has got PAS stockings and positioned  on the OR table.  The abdomen was prepped with Betadine solution and  then draped in sterile manner after induction of general anesthesia and  oral tube was placed to the stomach.  A small incision was made below  the umbilicus.  The fascia identified, picked up between two Kochers and  then carefully entered into the peritoneal cavity.  The  pursestring  suture of 0-0 Vicryl was placed and the Hasson cannula introduced.  On  inspection of the intrahepatic area, the gallbladder was not distended  or acutely inflamed.  It was kind of mildly intrahepatic and you could  see stones impacted in the gallbladder.  The upper 10 mL trocar was  placed under direct vision after anesthetizing fascia with Marcaine and  the two lateral 5-mm probe trocars were placed in the appropriate  lateral position.  Dr. Marcille Blanco, the assistant scrubbed in at this  time and we could grasp the gallbladder, retract it upward, outward and  then the proximal portion of the gallbladder, you could see stones kind  of  impacted in the gallbladder and then there were actually what looked  like possibly stones kind of going down into the actual cystic duct  area.  We dissected very carefully in this area since the area appeared  to be a little more proximal than usual and then could dissect down  around the area.  I could not actually definitely identify where the  junction of the  cystic duct and the gallbladder was and I could feel a  big stone sort of  impacted in this area.  I at this time when I was  trying to encompass the gallbladder there was little bit of bile and  cholesterol from the gallbladder and I actually placed a clip across it  at that time.  We then dissected just a millimeter or two more proximal  and made a little nick in the gallbladder and then retracted or actually  with right angle just removed the impacted stones in the area through  the little incision. Then it appeared that this was really right at the  gallbladder and cystic duct junction.  We placed a Cook catheter in the  proximal cystic duct, held in place with clip and then a cholangiogram  obtained.  The cystic duct looked like it is about a centimeter or so in  length and common bile duct was not dilated but on the first injection  we really did not get any dye that was going  into the duodenum.  We then  refilled the syringes and I put her in a slightly different position and  then on this we could see there was some flow going into the common bile  duct but it looked like possibly we got an air bubble.  We changed her  position, put her head way down repeated the x-ray and the air bubble  would flow on into the distal ampulla area and appeared that the bile  duct was normal.  There was about a centimeter length of cystic duct.  We removed the catheter and then I freed this area up and put three  clips across this area and making sure that I was not doing anything to  damage the common hepatic or the common hepatic bile duct.  We then  divided the gallbladder cystic duct junction and then very carefully  freed up the gallbladder which was kind of more closely adherent to  these structures than typical and did identify what I think was the  cystic artery which was doubly clipped proximally, singly distally and  divided and then the gallbladder was actually kind of on the mesentery  approximately but sort of intrahepatic most distally.  This was all  freed up with good hemostasis and we placed gallbladder in an EndoCatch  bag.  The camera switched the upper 10 mm trocar and then the bag  containing the gallbladder was grasped and brought out with the Hasson  cannula.  Placed an additional figure-of-eight suture of 0-0 Vicryl in  the fascia at the umbilicus and then anesthetized the fascia.  The  camera of course, was in the upper 10 mL trocar.  We irrigated,  aspirated, reinspected and there was no evidence of any bleeding or bile  and then removed the 5 mm ports under direct vision and removed the  upper 10 mL trocar.  I did place a fascia suture in the upper 10 mL  trocar area and then closed the subcutaneous wounds with 4-0 Vicryl.  Benzoin and Steri-Strips on skin.  The patient was a little slow to  awaken from anesthesia and when she went to recovery room  was still kind of drowsy, but breathing without problems and no wheezing.  We will plan  on keeping her overnight.  She should be able to be discharged in the  a.m.           ______________________________  Anselm Pancoast. Weatherly,  M.D.     WJW/MEDQ  D:  02/14/2007  T:  02/16/2007  Job:  [id number]

## 2011-04-22 NOTE — Discharge Summary (Signed)
NAME:  Bailey Stevenson, Bailey Stevenson            ACCOUNT NO.:  [id number]   MEDICAL RECORD NO.:  [id number]          PATIENT TYPE:  INP   LOCATION:  9126                          FACILITY:  [facility]   PHYSICIAN:  Richardean Sale, M.D.   DATE OF BIRTH:  1971-03-20   DATE OF ADMISSION:  01/02/2006  DATE OF DISCHARGE:  01/20/2006                                 DISCHARGE SUMMARY   ADMITTING DIAGNOSES:  1.  A 33-week intrauterine pregnancy with insulin-dependent diabetes.  2.  Fetus with 2-vessel umbilical cord.  3.  Preterm labor.   DISCHARGE DIAGNOSES:  1.  A 33-week intrauterine pregnancy with insulin-dependent diabetes.  2.  Fetus with 2-vessel umbilical cord.  3.  Preterm labor.  4.  Status post induction of labor.  5.  Cephalopelvic disproportion.  6.  Status post primary cesarean delivery.   PROCEDURES:  Primary low-transverse cesarean delivery performed on January 17, 2006.   OPERATIVE FINDINGS:  A viable female infant found at presentation with Apgars  of 9 and 9, intact placenta with 2-vessel umbilical cord and arterial cord  pH 7.14.   HOSPITAL COURSE AND HISTORY OF PRESENT ILLNESS:  Please see admission  history and physical for details.  Briefly, this is a 40 year old gravida 1,  para 0, African-American female, who was admitted at 33+ weeks' gestation  with insulin-dependent diabetes, 2-vessel umbilical cord and preterm labor.  The patient had been admitted previously this pregnancy and was discharged  home approximately 1 week prior to this admission.  The patient had  presented in the office for an NST and there was a spontaneous deceleration  noted.  Therefore, the patient was admitted for continuous fetal monitoring  and for blood glucose control.  The patient's antenatal course was  complicated by preterm contractions and vaginal bleeding.  Laboratory  studies and ultrasound showed no evidence of placental abruption.  In  addition, the antenatal course was complicated by poor  dietary compliance.  The patient remained hospitalized on continuous fetal monitoring and at 36  weeks' gestation underwent an amniocentesis for fetal lung maturity, which  revealed a positive LS ratio and positive PG.  The patient subsequently  underwent induction of labor.  She reached a complete dilation, +2 station,  the fetus began to have decreased variability and late decelerations.  An  attempt was made at a low vacuum assisted delivery, but was unsuccessful.  Therefore, the patient underwent an urgent low-transverse cesarean section,  which resulted in delivery of a viable female infant with Apgars of 9 and 9.  The patient's postoperative course was unremarkable.  She remained afebrile  throughout her hospital stay.  Her blood sugars improved postoperatively and  her insulin drip that she had been placed on during her labor was  discontinued.  Prior to pregnancy, the patient was not known to be an  insulin-dependent diabetic.  With improvement of the patient's blood sugars  postpartum, she was discharged home with instructions to continue checking  her blood sugars and follow up was arranged with endocrinology for further  management decisions.  The patient was subsequently discharged to home on  postoperative  day #3 in good condition.   DISPOSITION:  To home.   CONDITION:  Good.   INSTRUCTIONS:  The patient is to call for fever greater than 100.5,  increased vaginal bleeding, increase in abdominal pain, redness or drainage  from her incision, or for blood sugar greater than 200.   FOLLOWUP:  The patient will followup in 1 week for incision check and in 4  weeks for a routine postpartum visit.   MEDICATIONS:  1.  Percocet 1 tab p.o. q.4-6h. p.r.n. pain.  2.  Ferrous sulfate 325 mg p.o. daily.   LABORATORY STUDIES:  Postop day #1:  Hemoglobin 9.8, hematocrit 28.4,  platelet count 206 and white count 13.8.  Coagulation studies within normal  limits.  Fibrinogen 638, which is  consistent with third trimester pregnancy.  RPR nonreactive.      Richardean Sale, M.D.  Electronically Signed     JW/MEDQ  D:  02/19/2006  T:  02/20/2006  Job:  [id number]

## 2011-04-22 NOTE — H&P (Signed)
NAME:  Bailey Stevenson, Bailey Stevenson            ACCOUNT NO.:  [id number]   MEDICAL RECORD NO.:  [id number]          PATIENT TYPE:  INP   LOCATION:  9154                          FACILITY:  [facility]   PHYSICIAN:  Richardean Sale, M.D.   DATE OF BIRTH:  Nov 09, 1971   DATE OF ADMISSION:  10/11/2005  DATE OF DISCHARGE:                                HISTORY & PHYSICAL   ADMISSION DIAGNOSIS:  Advanced cervical dilation and short cervix at  approximately [redacted] weeks gestation.   HISTORY OF PRESENT ILLNESS:  This is a 40 year old Gravida 1, Para 0,  African-American female who has a history of infertility who after 5 months  of amenorrhea, took a home pregnancy test that was positive. Last menstrual  period is Apr 26, 2005. Menses prior to that cycle were roughly every 28 to  32 days. The patient presented today to the office for prenatal care and was  without complaints. She underwent an ultrasound to establish her due date,  which showed a pregnancy at approximately [redacted] weeks gestation. A transvaginal  cervical length was performed, which revealed a cervical length of only 7  mm. The patient denies any vaginal bleeding, loss of fluid or contractions  and reports movement.   PAST MEDICAL HISTORY:  1.  Noninsulin-dependent diabetes. The patient unsure of her most recent      hemoglobin A1C.  2.  Asthma. No prior hospitalizations for asthma.   PAST SURGICAL HISTORY:  None.   OBSTETRIC HISTORY:  First pregnancy.   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 No history of abnormal pap smears, cervical surgery, or  sexually transmitted infections.   FAMILY HISTORY:  Positive for diabetes in her grandmother, mother, and a  cousin. Heart disease in her mother. Hypertension in her mother, aunt, and a  cousin. Mother died of renal failure secondary to diabetes and hypertension.  No history of birth defects, congenital anomalies, cystic fibrosis, Down  syndrome, spina  bifida, other trisomies, or any inherited congenital  anomalies.   SOCIAL HISTORY:  Former smoker. Quit approximately 7 months ago. Denies  alcohol or illegal drug use. Is married.   MEDICATIONS:  Albuterol as needed, Singulair 10 mg tablets p.o. q.d., and  Advair.   ALLERGIES:  No known drug allergies.  She is allergic to [facility] and EGGS.   PHYSICAL EXAMINATION:  VITAL SIGNS:  She is afebrile. Vital signs are  stable. Fetal heart tones in the 140's on ultrasound.  GENERAL:  She is a well developed, well nourished, African female who is in  no acute distress.  HEART:  Regular rate and rhythm.  LUNGS:  Clear to auscultation bilaterally.  ABDOMEN:  Gravid, soft, nontender. Fundus is 2 cm above the umbilicus.  EXTREMITIES:  No clubbing, cyanosis, or edema.  PELVIC:  Sterile vaginal examination, cervix is 1 cm, 70% effaced, minus 3  station, breech presentation.   ASSESSMENT:  A 40 year old Gravida 1, Para 0, African-American female at  approximately [redacted] weeks gestation by last menstrual period and ultrasound  today, who presents with advanced cervical dilation and short cervix. No  symptoms of  pre-term labor. No history of cervical surgery.   PLANS:  1.  Will admit to [facility] on the Antenatal Unit for bedrest.  2.  Check prenatal laboratory panel.  3.  Will order complete OB sonogram, as patient has presented too late for      any antenatal testing and given history of diabetes.  4.  Check hemoglobin A1C, TSH. Start 24 hour urine for total protein and      creatinine clearance.  5.  Start assessing blood sugars, fasting and 2 hour postprandial's given      history of diabetes.  6.  Continue tocometer tracing to evaluate for contractions.  7.  Start ampicillin for beta strep prophylaxis and obtain group B strep      culture.  8.  Will await laboratory studies and results of comprehensive ultrasound      and will consider placement of emergent cerclage, pending those  results.  9.  Plans reviewed with patient in details. All questions answered.      Richardean Sale, M.D.  Electronically Signed     JW/MEDQ  D:  10/11/2005  T:  10/11/2005  Job:  [id number]

## 2011-04-22 NOTE — H&P (Signed)
NAME:  Bailey Stevenson, Bailey Stevenson NO.:  [id number]   MEDICAL RECORD NO.:  [id number]          PATIENT TYPE:  MAT   LOCATION:  MATC                          [facility]ACILITY:  [facility]   PHYSICIAN:  Richardean Sale, M.D.   DATE OF BIRTH:  1971-03-01   DATE OF ADMISSION:  01/02/2006  DATE OF DISCHARGE:                                HISTORY & PHYSICAL   ADMITTING DIAGNOSIS:  A 33-plus week intrauterine pregnancy with insulin-  dependent diabetes with nephropathy, two-vessel umbilical cord, preterm  labor with spontaneous deceleration on fetal monitoring and abnormal  umbilical artery Dopplers.   HISTORY OF PRESENT ILLNESS:  This is a 40 year old, gravida 1, para 0,  African-American female who is at 11 and [redacted] weeks gestation with a due date  of February 14, 2006 by 23 week ultrasound with known insulin-dependent  diabetes and nephropathy, two-vessel umbilical cord, and preterm labor.  The  patient was discharged from the hospital 5 days prior to this admission  after a prolonged 3 month hospitalization for the above-mentioned problems.  The patient presented to the office today for routine twice weekly NSTs, and  had a spontaneous deceleration of 4 minutes down to the 80s and 90s.  The  patient was subsequently sent to maternity admissions for additional  evaluation.  Ultrasound was performed which showed a biophysical profile at  8/8, amniotic fluid index of 8, and an elevated umbilical artery Doppler.  The patient is subsequently being admitted for bed rest and continuous fetal  monitoring.  Prenatal care has been at [facility] with Dr. Richardean Sale as the primary attending.  During the patient's prior hospitalization,  she did undergo consultation with Dr. Ander Slade, associated with [facility] perinatology, and, in addition, she has had a fetal  echocardiogram which was normal, and a complete anatomic survey, which was  normal with the exception of the  above-mentioned two-vessel umbilical cord.   PAST MEDICAL HISTORY:  1.  Insulin-dependent diabetes with an abnormal hemoglobin A1C upon      presentation at 23 weeks.  2.  Asthma with no prior hospitalizations.   PAST SURGICAL HISTORY:  None.   OBSTETRIC HISTORY:  First pregnancy.   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  No history of abnormal Pap smears, cervical surgery or  sexually transmitted infections.   FAMILY HISTORY:  Positive for diabetes in her mother, grandmother and  cousin.  Heart disease in her mother.  Hypertension in her mother, aunt and  a cousin.  Mother died of renal failure secondary to diabetes and  hypertension.  There is no history of birth defects, congenital anomalies,  cystic fibrosis, Down syndrome, spinal bifida, trisomies or any inherited  congenital abnormalities.   SOCIAL HISTORY:  She is a former smoker and quit approximately 10 months  ago.  Denies alcohol or illegal drug use, and is married.   MEDICATIONS:  1.  Albuterol as needed.  2.  Insulin 20 units of NovoLog with each meal and 22 units of NPH with      breakfast and 30 units  of NPH at bedtime.  3.  Procardia 10 mg p.o. q.6h.   ALLERGIES:  No known drug allergies.  She is allergic to [facility] and EGGS.   PHYSICAL EXAMINATION:  VITAL SIGNS:  She is afebrile.  Vital signs are  stable.  GENERAL:  She is a well-developed, well-nourished African-American female  who is no acute distress.  HEART:  Regular rate and rhythm.  LUNGS:  Clear to auscultation bilaterally.  ABDOMEN:  Gravid, soft, nontender.  Appears AGA.  EXTREMITIES:  No cyanosis, clubbing or edema, and nontender.  PELVIC:  Deferred.   Fetal heart rate tracing.  Baseline is in the 120s with accelerations noted.  Tachometer tracing shows irregular contractions.   PRENATAL LABORATORIES:  Group B beta strep is positive.  Gonorrhea and  Chlamydia screen negative.  Blood type is  B positive.  Antibody screen  negative.  RPR nonreactive.  Rubella immune.  Hepatitis B surface antigen  nonreactive.  TSH within normal limits.  Hemoglobin A1C at 23 weeks was  elevated at 7.7.  Repeats at approximately 30 weeks with 6.3.  A 24-hour  urine at 30 weeks was 448 mg of protein, which was stable from her 23 week  evaluation.  Hemoglobin electrophoresis within normal limits.   ASSESSMENT:  A 40 year old gravida 1, para 0 black female at 84 and [redacted] weeks  gestation with insulin-dependent diabetes and nephropathy, two vessel  umbilical cord, preterm labor, now with spontaneous deceleration on fetal  monitoring and slightly elevated umbilical artery Doppler.   PLAN:  1.  Will admit for hospital bed rest and continuous fetal monitoring.  2.  Repeat ultrasound and biophysical profile with Doppler studies in 48      hours, and continued twice weekly.  3.  Continue current insulin regimen, and check blood sugars fasting and 2      hours postprandial.  4.  Continue Procardia 10 mg p.o. q.6h. and monitor for signs of labor.  5.  Given patient's insulin-dependent diabetes, if the patient remains      pregnancy at 36 plus weeks pregnant, will proceed with amniocentesis for      fetal lung maturity and induce if mature.  The patient did receive      betamethasone at 26 weeks.      Richardean Sale, M.D.  Electronically Signed     JW/MEDQ  D:  01/02/2006  T:  01/02/2006  Job:  [id number]

## 2011-04-22 NOTE — Op Note (Signed)
NAME:  Bailey Stevenson, Bailey Stevenson            ACCOUNT NO.:  [id number]   MEDICAL RECORD NO.:  [id number]          PATIENT TYPE:  INP   LOCATION:  9126                          FACILITY:  [facility]   PHYSICIAN:  Richardean Sale, M.D.   DATE OF BIRTH:  09/13/1971   DATE OF PROCEDURE:  01/17/2006  DATE OF DISCHARGE:                                 OPERATIVE REPORT   PREOPERATIVE DIAGNOSES:  1.  Intrauterine pregnancy at 36 weeks.  2.  Insulin-dependent diabetes mellitus.  3.  Two-vessel cord.  4.  Non-reassuring fetal heart rate tracing.  5.  Unsuccessful vacuum-assisted delivery.   POSTOPERATIVE DIAGNOSES:  1.  Intrauterine pregnancy at 36 weeks.  2.  Insulin-dependent diabetes mellitus.  3.  Two-vessel cord.  4.  Non-reassuring fetal heart rate tracing.  5.  Unsuccessful vacuum-assisted delivery.   OPERATION/PROCEDURE:  Urgent low transverse cesarean section.   SURGEON:  Richardean Sale, M.D.   ASSISTANT:  None.   COMPLICATIONS:  None.   ESTIMATED BLOOD LOSS:  600 mL.   FINDINGS:  Viable female infant in cephalic presentation.  Right occiput  anterior.  Apgars 9 and 9.  Intact placenta with two-vessel umbilical cord  sent to pathology.  Arterial cord PH is 7.14.   INDICATIONS:  This is a 40 year old, gravida 1, para 0, African American  female who was being induced at [redacted] weeks gestation for insulin-dependent  diabetes, two-vessel umbilical cord, and preterm labor with status post  amniocentesis on January 16, 2006 which  showed a positive fetal lung  maturity and LS ratio.  The patient's pregnancy was complicated by a short  cervix, preterm labor, insulin-dependent diabetes, and fetus with two-vessel  umbilical cord.  She has been hospitalized since approximately [redacted] weeks  gestation.  The patient underwent induction of labor with Pitocin and  amniotomy.  She made excellent progress in labor and fetal heart rate  tracing remained reassuring throughout her labor.  When the patient reached  complete dilatation and began pushing, fetal heart rate tracing showed  moderate to severe variable decelerations.  The patient was complete and  complete and +2 station with right occiput anterior.  Given the fetal heart  rate tracing, I recommended an attempt at an operative delivery.  The pelvis  was examined and felt to be adequate.  The bladder was drained with the red  rubber catheter.  The patient was verbally consented upon indications and  use of the vacuum and its potential complications and agreed to proceed.  The Kiwi vacuum was then applied over the fetal flexion point and with the  first application of the vacuum, there was descent of the fetal vertex.  With the second contraction, there was no further descent of the vertex.  Vacuum was disconnected.  Pelvis was reexamined.  The patient attempted to  push with one more contraction with no further descent.  Given arrest of  descent and the fetal heart rate tracing with decelerations down to the  70's, I made the recommendation to proceed with cesarean delivery.  The  risks and benefits of cesarean section were reviewed with the patient prior  to  proceeding to the OR and consent was obtained.   DESCRIPTION OF PROCEDURE:  The patient was then taken to the operating room  where her epidural was tested and found to be adequate.  A Foley catheter  was placed.  The patient was prepped and draped quickly in the usual sterile  fashion, then was placed in the supine position with a leftward tilt.  Pfannenstiel skin incision was removed with the scalpel.  This was carried  down sharply to the fascia.  The fascia was then incised in the midline and  the fascial incision was grasped with Kocher clamps and elevated and the  underlying rectus muscles were dissected off with both sharp and blunt  dissection.  The muscles were then separated in the midline.  The peritoneum  was then entered bluntly and the peritoneal incision was then  extended  superiorly and inferiorly with good visualization of the bladder.  The  bladder flap was created by using both sharp and blunt dissection.  The  bladder blade was then inserted and the lower uterine segment was incised in  the transverse fashion with the scalpel.  The bladder blade was then removed  and hand was delivered into the incision and the infant's head was then  delivered in a flexed position atraumatically.  Nose and mouth were  suctioned with the bulb.  Infant was then delivered to the sterile field  with a vigorous cry.  Cord was clamped and cut and infant was handed off to  the waiting NICU attendant.  Apgars were 9 and 9.  Arterial cord PH was  obtained which was 7.14.  The placenta was then removed spontaneously intact  and was sent to pathology.  Two-vessel cord, which had been previously  diagnosed, was noted.  The uterus was cleared of all clots and debris and  the uterine incision was repaired with #1 chromic in a running locked  fashion.  A second layer of the same suture was then placed in an  imbricating fashion for additional hemostasis.  Once hemostasis of the  uterine incision was confirmed, the adnexa were inspected and were normal.  The pelvis was irrigated copiously with warm normal saline.  The uterine  incision, peritoneal surfaces, rectus muscles, and bladder flap were all  inspected and any areas of bleeding were cauterized with the Bovie.  Once  hemostasis was assured,  the fascia was then closed with a running Vicryl  suture.  The subcutaneous space was thoroughly irrigated.  Any areas of  bleeding were cauterized with the Bovie.  Interrupted 2-0 plain gut sutures  were placed to facilitate closure of the subcutaneous space and the skin was  then closed with staples.   The patient tolerated the procedure well.  All sponge, lap, needle and  instrument counts were correct x2.  She was taken to the recovery room awake in stable condition.  There  were no complications.  The infant was taken to  the regular nursery doing well at the time of this dictation.      Richardean Sale, M.D.  Electronically Signed     JW/MEDQ  D:  01/17/2006  T:  01/18/2006  Job:  [id number]

## 2011-12-12 ENCOUNTER — Encounter: Payer: Self-pay | Admitting: Anesthesiology

## 2011-12-19 ENCOUNTER — Ambulatory Visit (INDEPENDENT_AMBULATORY_CARE_PROVIDER_SITE_OTHER): Payer: 59 | Admitting: Gynecology

## 2011-12-19 ENCOUNTER — Other Ambulatory Visit (HOSPITAL_COMMUNITY)
Admission: RE | Admit: 2011-12-19 | Discharge: 2011-12-19 | Disposition: A | Discharge status: Home / Self Care | Payer: 59 | Source: Ambulatory Visit | Attending: Gynecology | Admitting: Gynecology

## 2011-12-19 ENCOUNTER — Encounter: Payer: Self-pay | Admitting: Gynecology

## 2011-12-19 ENCOUNTER — Encounter: Payer: No Typology Code available for payment source | Admitting: Gynecology

## 2011-12-19 DIAGNOSIS — Z01419 Encounter for gynecological examination (general) (routine) without abnormal findings: Secondary | ICD-10-CM | POA: Insufficient documentation

## 2011-12-19 DIAGNOSIS — E1165 Type 2 diabetes mellitus with hyperglycemia: Secondary | ICD-10-CM | POA: Insufficient documentation

## 2011-12-19 DIAGNOSIS — Z833 Family history of diabetes mellitus: Secondary | ICD-10-CM

## 2011-12-19 DIAGNOSIS — E119 Type 2 diabetes mellitus without complications: Secondary | ICD-10-CM

## 2011-12-19 DIAGNOSIS — Z113 Encounter for screening for infections with a predominantly sexual mode of transmission: Secondary | ICD-10-CM

## 2011-12-19 DIAGNOSIS — IMO0002 Reserved for concepts with insufficient information to code with codable children: Secondary | ICD-10-CM | POA: Insufficient documentation

## 2011-12-19 DIAGNOSIS — N898 Other specified noninflammatory disorders of vagina: Secondary | ICD-10-CM

## 2011-12-19 DIAGNOSIS — A5901 Trichomonal vulvovaginitis: Secondary | ICD-10-CM

## 2011-12-19 LAB — CBC WITH DIFFERENTIAL/PLATELET
Eosinophils Relative: 7 % — ABNORMAL HIGH (ref 0–5)
HCT: 43 % (ref 36.0–46.0)
Lymphocytes Relative: 40 % (ref 12–46)
MCHC: 35.6 g/dL (ref 30.0–36.0)
Neutrophils Relative %: 47 % (ref 43–77)
Platelets: 203 10*3/uL (ref 150–400)
RBC: 4.81 MIL/uL (ref 3.87–5.11)
RDW: 13 % (ref 11.5–15.5)

## 2011-12-19 LAB — WET PREP FOR TRICH, YEAST, CLUE: Yeast Wet Prep HPF POC: NEGATIVE

## 2011-12-19 LAB — URINALYSIS W MICROSCOPIC + REFLEX CULTURE
Bilirubin Urine: NEGATIVE
Ketones, ur: NEGATIVE mg/dL
Leukocytes, UA: NEGATIVE
Nitrite: NEGATIVE
Protein, ur: NEGATIVE mg/dL
pH: 5.5 (ref 5.0–8.0)

## 2011-12-19 LAB — HEMOGLOBIN A1C: Mean Plasma Glucose: 240 mg/dL — ABNORMAL HIGH (ref <117)

## 2011-12-19 MED ORDER — METRONIDAZOLE 500 MG PO TABS: 500.0000 mg | ORAL_TABLET | Freq: Two times a day (BID) | ORAL | Status: AC

## 2011-12-19 NOTE — Progress Notes (Signed)
Bailey Stevenson 08-Jul-1971 [id number]   History:    41 y.o.  for annual exam and complaining of slight vaginal discharge. Patient type II diabetic on metformin 500 mg twice a day. Patient has been a type II diabetic since her 51s. Patient with poor followup has not seen her primary physician quite some time. Patient has not been monitoring her blood sugars at home. Patient had a Mirena IUD in 2007 is overdue to be removed and she's here to have it removed today. She is interested in the 1 been placed as soon as possible. Her weight has remained stable over the past year. She is in a monogamous relationship. She will be 41 at the end of the year which she'll need her baseline mammogram. She frequently does her self breast examination. Declined flu vaccine today.  Past medical history,surgical history, family history and social history were all reviewed and documented in the EPIC chart.  Gynecologic History No LMP recorded. Patient is not currently having periods (Reason: IUD). Contraception: IUD Last Pap: 2012. Results were: normal Last mammogram: No prior study. Results were: No prior study  Obstetric History OB History    Grav Para Term Preterm Abortions TAB SAB Ect Mult Living   1 1 1       1      # Outc Date GA Lbr Len/2nd Wgt Sex Del Anes PTL Lv   1 TRM     M CS  No Yes       ROS:  Was performed and pertinent positives and negatives are included in the history.  Exam: chaperone present  BP 128/78  Ht 5' 2.25" (1.581 m)  Wt 136 lb (61.689 kg)  BMI 24.68 kg/m2  Body mass index is 24.68 kg/(m^2).  General appearance : Well developed well nourished female. No acute distress HEENT: Neck supple, trachea midline, no carotid bruits, no thyroidmegaly Lungs: Clear to auscultation, no rhonchi or wheezes, or rib retractions  Heart: Regular rate and rhythm, no murmurs or gallops Breast:Examined in sitting and supine position were symmetrical in appearance, no palpable masses or  tenderness,  no skin retraction, no nipple inversion, no nipple discharge, no skin discoloration, no axillary or supraclavicular lymphadenopathy Abdomen: no palpable masses or tenderness, no rebound or guarding Extremities: no edema or skin discoloration or tenderness  Pelvic:  Bartholin, Urethra, Skene Glands: Within normal limits             Vagina: No gross lesions blood in vault along with a white discharge  Cervix: No gross lesions or discharge IUD string seen  Uterus  retroverted, normal size, shape and consistency, non-tender and mobile  Adnexa  Without masses or tenderness  Anus and perineum  normal   Rectovaginal  normal sphincter tone without palpated masses or tenderness             Hemoccult not done   Wet prep:Trichomoniasis  Assessment/Plan:  41 y.o. female for annual exam with type 2 diabetes for the past 30 years. Currently on metformin 500 mg twice a day. Has had poor followup with her primary physician. Patient has not been checking her blood sugars at home. We'll refer to endocrinologist and we'll check her hemoglobin A1c along with her CBC urinalysis cholesterol and Pap smear. Mirena IUD was removed in a sterile fashion shown to the patient discarded. She will return back later in the week to have a new one put in. Patient declined flu vaccine. Since patient's wet prep demonstrated trichomoniasis we proceeded with  doing a GC and Chlamydia culture and to complete the STD screening she will stop by the lab to check an HIV RPR hepatitis B and hepatitis C. We'll hold off on place a new Mirena IUD until her next cycle and prior to that we'll check an rescreen her again with a wet prep. Her partner needs to be treated as well.   Ok Edwards MD, 12:34 PM 12/19/2011

## 2011-12-19 NOTE — Patient Instructions (Signed)
Trichomoniasis Trichomoniasis is an infection, caused by the Trichomonas organism, that affects both women and men. In women, the outer female genitalia and the vagina are affected. In men, the penis is mainly affected, but the prostate and other reproductive organs can also be involved. Trichomoniasis is a sexually transmitted disease (STD) and is most often passed to another person through sexual contact. The majority of people who get trichomoniasis do so from a sexual encounter and are also at risk for other STDs. CAUSES   Sexual intercourse with an infected partner.   It can be present in swimming pools or hot tubs.  SYMPTOMS   Abnormal gray-green frothy vaginal discharge in women.   Vaginal itching and irritation in women.   Itching and irritation of the area outside the vagina in women.   Penile discharge with or without pain in males.   Inflammation of the urethra (urethritis), causing painful urination.   Bleeding after sexual intercourse.  RELATED COMPLICATIONS  Pelvic inflammatory disease.   Infection of the uterus (endometritis).   Infertility.   Tubal (ectopic) pregnancy.   It can be associated with other STDs, including gonorrhea and chlamydia, hepatitis B, and HIV.  COMPLICATIONS DURING PREGNANCY  Early (premature) delivery.   Premature rupture of the membranes (PROM).   Low birth weight.  DIAGNOSIS   Visualization of Trichomonas under the microscope from the vagina discharge.   Ph of the vagina greater than 4.5, tested with a test tape.   Trich Rapid Test.   Culture of the organism, but this is not usually needed.   It may be found on a Pap test.   Having a "strawberry cervix,"which means the cervix looks very red like a strawberry.  TREATMENT   You may be given medication to fight the infection. Inform your caregiver if you could be or are pregnant. Some medications used to treat the infection should not be taken during pregnancy.    Over-the-counter medications or creams to decrease itching or irritation may be recommended.   Your sexual partner will need to be treated if infected.  HOME CARE INSTRUCTIONS   Take all medication prescribed by your caregiver.   Take over-the-counter medication for itching or irritation as directed by your caregiver.   Do not have sexual intercourse while you have the infection.   Do not douche or wear tampons.   Discuss your infection with your partner, as your partner may have acquired the infection from you. Or, your partner may have been the person who transmitted the infection to you.   Have your sex partner examined and treated if necessary.   Practice safe, informed, and protected sex.   See your caregiver for other STD testing.  SEEK MEDICAL CARE IF:   You still have symptoms after you finish the medication.   You have an oral temperature above 102 F (38.9 C).   You develop belly (abdominal) pain.   You have pain when you urinate.   You have bleeding after sexual intercourse.   You develop a rash.   The medication makes you sick or makes you throw up (vomit).  Document Released: 05/17/2001 Document Revised: 08/03/2011 Document Reviewed: 06/12/2009 Exit[employer] Patient Information 2012 [employer]. 

## 2011-12-20 ENCOUNTER — Telehealth: Payer: Self-pay | Admitting: *Deleted

## 2011-12-20 ENCOUNTER — Other Ambulatory Visit: Payer: Self-pay | Admitting: *Deleted

## 2011-12-20 DIAGNOSIS — Z3049 Encounter for surveillance of other contraceptives: Secondary | ICD-10-CM

## 2011-12-20 LAB — GC/CHLAMYDIA PROBE AMP, GENITAL: GC Probe Amp, Genital: NEGATIVE

## 2011-12-20 LAB — HEPATITIS C ANTIBODY: HCV Ab: NEGATIVE

## 2011-12-20 MED ORDER — LEVONORGESTREL 20 MCG/24HR IU IUD: INTRAUTERINE_SYSTEM | Freq: Once | INTRAUTERINE | Status: DC

## 2011-12-20 NOTE — Telephone Encounter (Signed)
Patient informed insurance benefits for Mirena IUD.  Patient will have a $130 copay at time of insert.  Will have done with JF on 01/19/12.

## 2012-01-19 ENCOUNTER — Ambulatory Visit (INDEPENDENT_AMBULATORY_CARE_PROVIDER_SITE_OTHER): Payer: 59 | Admitting: Gynecology

## 2012-01-19 ENCOUNTER — Encounter: Payer: Self-pay | Admitting: Gynecology

## 2012-01-19 VITALS — BP 120/78

## 2012-01-19 DIAGNOSIS — Z30430 Encounter for insertion of intrauterine contraceptive device: Secondary | ICD-10-CM

## 2012-01-19 DIAGNOSIS — Z113 Encounter for screening for infections with a predominantly sexual mode of transmission: Secondary | ICD-10-CM

## 2012-01-19 DIAGNOSIS — N898 Other specified noninflammatory disorders of vagina: Secondary | ICD-10-CM

## 2012-01-19 LAB — WET PREP FOR TRICH, YEAST, CLUE: Clue Cells Wet Prep HPF POC: NONE SEEN

## 2012-01-19 NOTE — Progress Notes (Signed)
Patient presented to the office today for placement of her new Mirena IUD. She had been seen the office for her annual exam on January 14 at which time her expired IUD was removed. She had a slight vaginal discharge and a wet prep did demonstrate she had trichomoniasis and was placed on Flagyl 500 mg twice a day for 7 day course. She had questions today about a future tubal ligation and literature formation was provided. We did a test of cure with a wet prep today which was essentially negative. She was counseled as to potential risk of any future a ascending infection that could affect future fertility and she fully accepts the risk of PID an ectopic pregnancies in the future and wanted to have the IUD placed in today.. Consent form was signed.  Procedure, Pelvic examination demonstrated a retroverted uterus with no palpable adnexal masses. The cervix was cleansed with Betadine solution. Single-tooth tenaculum was placed on the anterior cervical lip. The uterus sounded to 7-1/2 cm. The IUD was inserted sterilely and the IUD string was trimmed. Patient was given an Aleve for discomfort after the procedure. She'll return back in a month for followup.

## 2012-01-19 NOTE — Patient Instructions (Signed)
Laparoscopic Tubal Ligation Laparoscopic tubal ligation is a procedure that closes the fallopian tubes. Tubal ligation is also known as getting your "tubes tied." It is a brief, common and relatively simple surgical procedure. Tubal ligation is done to cause sterilization. Sterilization means you will not be able to get pregnant or have a baby. Sometimes a tubal ligation may be reversed, but this should not be considered a possibility because of failure to get pregnant and the increased risk of tubal (ectopic) pregnancy. If you want to have future pregnancies, you should not have this procedure. Use other forms of contraception. Tubal ligation can be done at any time during your menstrual cycle. This procedure has a less than 1% failure rate. Tubal ligation does not protect against sexually transmitted disease. LET YOUR CAREGIVER KNOW ABOUT:  Allergies, especially to medicines.   All the medicines you are taking, even herbs, eyedrops, steroids, creams, and over-the-counter drugs.   The possibility of being pregnant.   Past problems with medicines.   History of blood clots or other blood problems.   Past surgeries, medical or health problems.  RISKS AND COMPLICATIONS  Your caregiver will discuss the risks with you before the procedure. Some problems that can happen after this procedure include:  Infection. A germ starts growing in one of the wounds. This can usually be treated with antibiotic medicines.   Bleeding following surgery. Your surgeon takes every precaution to keep this from happening.   Damage to other organs. If damage to other organs or excessive bleeding should occur, it may be necessary to convert the laparoscopic procedure into an open abdominal procedure. Scarring (adhesions) from past surgeries or disease may also be a cause to change this procedure to an open abdominal operation.   Anesthetic side effects.  BEFORE THE PROCEDURE  Do not take aspirin or blood thinners a  week before the procedure. This can cause bleeding. Do as directed by your caregiver.   Do not eat or drink anything 6 to 8 hours before the procedure.   Let your caregiver know if you get a cold or an infection before the procedure.   Arrive at the clinic or hospital 60 minutes before the surgery or as directed.  PROCEDURE   You may be given a mild drug (sedative) to help you relax before the procedure. Once in the operating room, you will be given a general anesthetic to make you sleep (unless you and your caregiver choose a different anesthetic).   Once you are sleeping, your surgeon makes your abdomen larger with a harmless gas (carbon dioxide). This makes your organs easier to see and gives room to operate.   A laparoscope is then inserted into your abdomen through a small cut (incision) below the navel. A laparoscope is a thin, lighted, pencil-sized instrument. It is like a telescope. Once inserted, your caregiver can look at the organs inside your abdomen. He or she can see if there is anything abnormal.   Other small instruments also are put into the abdomen through other small openings (portals). Many surgeons attach a video camera to the laparoscope to enlarge the view. The fallopian tubes are located and either burned closed (cauterized) or a plastic clip is placed on them to close the tubes.  Sometimes, your surgeon may take tissue samples (biopsies) from other organs if he or she sees something abnormal. Biopsies can help to diagnose or confirm a disease. The samples are examined under a microscope. AFTER THE PROCEDURE   The gas   is released from inside your abdomen.   Your incisions are closed with stitches (sutures). Because these incisions are small (usually less than  inch), there is usually little discomfort following the procedure.   You will rest in a recovery room until you are stable and doing well.   As long as there are no problems, you may be allowed to go home.    Someone will need to drive you home.   You will have some mild discomfort in the throat. This is from the tube placed in your throat while you were sleeping.   You may experience discomfort in the shoulder area from some trapped air between the liver and diaphragm. This will slowly go away on its own.   You will also have some mild abdominal discomfort. You will also have discomfort from the incisions where the instruments were placed into your abdomen.  HOME CARE INSTRUCTIONS   Only take over-the-counter or prescription medicines for pain, discomfort, or fever as directed by your caregiver. Do not take aspirin. It can cause bleeding.   Resume daily activities, diet, rest, driving, and work as directed.   Showers are preferred over baths.   Resume sexual activities in 1 week or as directed.   If biopsies were taken, find out how to get your results. The results are usually given during the follow-up visit. Do not assume tests are normal if you do not hear from your caregiver. It is your responsibility to obtain your results.   Change the dressings as directed.   It is helpful to have someone with you for several hours after you go home. They can help you and be available if you have problems.  SEEK MEDICAL CARE IF:   You have increasing abdominal pain.   You develop new pain in your shoulders in the shoulder strap area that gets worse with time. Some pain is common and expected.   You feel lightheaded or faint.   You have chills or fever.   You develop bleeding or drainage from the suture sites or the vagina after surgery.   You develop chest pain.   You develop shortness of breath.   You develop a rash.  Document Released: 02/27/2001 Document Revised: 08/03/2011 Document Reviewed: 06/11/2008 [employer] Patient Information 2012 [employer]. 

## 2012-02-15 ENCOUNTER — Encounter: Payer: Self-pay | Admitting: Gynecology

## 2012-02-15 ENCOUNTER — Ambulatory Visit (INDEPENDENT_AMBULATORY_CARE_PROVIDER_SITE_OTHER): Payer: 59 | Admitting: Gynecology

## 2012-02-15 VITALS — BP 120/78

## 2012-02-15 DIAGNOSIS — Z30431 Encounter for routine checking of intrauterine contraceptive device: Secondary | ICD-10-CM

## 2012-02-15 NOTE — Progress Notes (Signed)
Patient presented to the os today for one month followup after placing a Mirena IUD in the office on February 14. Patient is doing well with no complaints. She did followup with the endocrinologist to help regulate her type 2 diabetes and she's back on metformin. She will schedule her mammogram at the age of 45.  Exam: Abdomen: Soft nontender no rebound or guarding Pelvic: Bartholin urethra Skene was within normal limits Vagina: No gross lesions on inspection Cervix: No lesions or discharge, IUD string seen Uterus: Anteverted normal size shape and consistency Adnexa: No palpable masses or tenderness Rectal exam: Not done  Assessment/plan: One month followup after placement of Mirena IUD. Patient is doing well. She scheduled to return back next year for her annual exam. She is to continue to followup with endocrinologist for type 2 diabetes.

## 2012-02-15 NOTE — Patient Instructions (Signed)
Remember to schedule your mammogram 

## 2013-04-28 ENCOUNTER — Emergency Department (HOSPITAL_COMMUNITY)
Admission: EM | Admit: 2013-04-28 | Discharge: 2013-04-28 | Disposition: A | Discharge status: Home / Self Care | Payer: 59 | Attending: Emergency Medicine | Admitting: Emergency Medicine

## 2013-04-28 ENCOUNTER — Emergency Department (HOSPITAL_COMMUNITY): Payer: 59

## 2013-04-28 ENCOUNTER — Encounter (HOSPITAL_COMMUNITY): Payer: Self-pay | Admitting: *Deleted

## 2013-04-28 DIAGNOSIS — J029 Acute pharyngitis, unspecified: Secondary | ICD-10-CM | POA: Insufficient documentation

## 2013-04-28 DIAGNOSIS — Z79899 Other long term (current) drug therapy: Secondary | ICD-10-CM | POA: Insufficient documentation

## 2013-04-28 DIAGNOSIS — F17200 Nicotine dependence, unspecified, uncomplicated: Secondary | ICD-10-CM | POA: Insufficient documentation

## 2013-04-28 DIAGNOSIS — R062 Wheezing: Secondary | ICD-10-CM | POA: Insufficient documentation

## 2013-04-28 DIAGNOSIS — E119 Type 2 diabetes mellitus without complications: Secondary | ICD-10-CM | POA: Insufficient documentation

## 2013-04-28 DIAGNOSIS — J9801 Acute bronchospasm: Secondary | ICD-10-CM | POA: Insufficient documentation

## 2013-04-28 DIAGNOSIS — J45909 Unspecified asthma, uncomplicated: Secondary | ICD-10-CM | POA: Insufficient documentation

## 2013-04-28 DIAGNOSIS — R059 Cough, unspecified: Secondary | ICD-10-CM | POA: Insufficient documentation

## 2013-04-28 DIAGNOSIS — R05 Cough: Secondary | ICD-10-CM | POA: Insufficient documentation

## 2013-04-28 HISTORY — DX: Unspecified asthma, uncomplicated: J45.909

## 2013-04-28 LAB — CBC WITH DIFFERENTIAL/PLATELET
Eosinophils Relative: 3 % (ref 0–5)
Hemoglobin: 16.7 g/dL — ABNORMAL HIGH (ref 12.0–15.0)
Lymphocytes Relative: 16 % (ref 12–46)
Lymphs Abs: 1.3 10*3/uL (ref 0.7–4.0)
MCV: 88.1 fL (ref 78.0–100.0)
Monocytes Relative: 7 % (ref 3–12)
Neutrophils Relative %: 74 % (ref 43–77)
Platelets: 177 10*3/uL (ref 150–400)
RBC: 5.14 MIL/uL — ABNORMAL HIGH (ref 3.87–5.11)
WBC: 8.1 10*3/uL (ref 4.0–10.5)

## 2013-04-28 LAB — COMPREHENSIVE METABOLIC PANEL
ALT: 42 U/L — ABNORMAL HIGH (ref 0–35)
Alkaline Phosphatase: 179 U/L — ABNORMAL HIGH (ref 39–117)
BUN: 9 mg/dL (ref 6–23)
CO2: 27 mEq/L (ref 19–32)
GFR calc Af Amer: >90 mL/min (ref >90)
GFR calc non Af Amer: >90 mL/min (ref >90)
Glucose, Bld: 208 mg/dL — ABNORMAL HIGH (ref 70–99)
Potassium: 4 mEq/L (ref 3.5–5.1)
Sodium: 137 mEq/L (ref 135–145)
Total Bilirubin: 0.2 mg/dL — ABNORMAL LOW (ref 0.3–1.2)

## 2013-04-28 MED ORDER — ONDANSETRON HCL 4 MG/2ML IJ SOLN
4.0000 mg | Freq: Once | INTRAMUSCULAR | Status: AC
  Administered 2013-04-28: 4 mg via INTRAVENOUS
  Filled 2013-04-28: qty 2

## 2013-04-28 MED ORDER — HYDROMORPHONE HCL PF 1 MG/ML IJ SOLN
0.5000 mg | Freq: Once | INTRAMUSCULAR | Status: AC
  Administered 2013-04-28: 0.5 mg via INTRAVENOUS
  Filled 2013-04-28: qty 1

## 2013-04-28 MED ORDER — METHYLPREDNISOLONE SODIUM SUCC 125 MG IJ SOLR
125.0000 mg | Freq: Once | INTRAMUSCULAR | Status: AC
  Administered 2013-04-28: 125 mg via INTRAVENOUS
  Filled 2013-04-28: qty 2

## 2013-04-28 MED ORDER — ALBUTEROL SULFATE (5 MG/ML) 0.5% IN NEBU
5.0000 mg | INHALATION_SOLUTION | Freq: Once | RESPIRATORY_TRACT | Status: AC
  Administered 2013-04-28: 5 mg via RESPIRATORY_TRACT
  Filled 2013-04-28: qty 1

## 2013-04-28 MED ORDER — ALBUTEROL SULFATE HFA 108 (90 BASE) MCG/ACT IN AERS: 1.0000 | INHALATION_SPRAY | Freq: Four times a day (QID) | RESPIRATORY_TRACT | Status: DC | PRN

## 2013-04-28 MED ORDER — IPRATROPIUM BROMIDE 0.02 % IN SOLN
0.5000 mg | Freq: Once | RESPIRATORY_TRACT | Status: AC
  Administered 2013-04-28: 0.5 mg via RESPIRATORY_TRACT
  Filled 2013-04-28: qty 2.5

## 2013-04-28 NOTE — ED Provider Notes (Signed)
History     CSN: [id number]  Arrival date & time 04/28/13  1108   First MD Initiated Contact with Patient 04/28/13 1115      Chief Complaint  Patient presents with  . Shortness of Breath  . Wheezing  . Cough  . Sore Throat    (Consider location/radiation/quality/duration/timing/severity/associated sxs/prior treatment) Patient is a 42 y.o. female presenting with shortness of breath, wheezing, cough, and pharyngitis. The history is provided by the patient (the pt complains of sob.  she is out of her inhaler).  Shortness of Breath Severity:  Moderate Onset quality:  Sudden Timing:  Constant Progression:  Worsening Chronicity:  Recurrent Context: activity   Associated symptoms: cough and wheezing   Associated symptoms: no abdominal pain, no chest pain, no headaches and no rash   Wheezing Associated symptoms: cough and shortness of breath   Associated symptoms: no chest pain, no fatigue, no headaches and no rash   Cough Associated symptoms: shortness of breath and wheezing   Associated symptoms: no chest pain, no eye discharge, no headaches and no rash   Sore Throat Associated symptoms include shortness of breath. Pertinent negatives include no chest pain, no abdominal pain and no headaches.    Past Medical History  Diagnosis Date  . Diabetes mellitus     TYPE II  . Asthma     Past Surgical History  Procedure Laterality Date  . Cholecystectomy    . Cesarean section      Family History  Problem Relation Age of Onset  . Diabetes Maternal Grandmother   . Breast cancer Maternal Grandmother   . Hypertension Mother   . Diabetes Mother   . Kidney failure Mother   . Hypertension Father   . Diabetes Father   . Stroke Father   . Hypertension Maternal Aunt   . Diabetes Maternal Aunt   . Cancer Maternal Aunt     throat  . Hypertension Maternal Uncle   . Diabetes Maternal Uncle   . Hypertension Paternal Aunt   . Diabetes Paternal Aunt   . Hypertension Paternal Uncle    . Diabetes Paternal Uncle     History  Substance Use Topics  . Smoking status: Current Every Day Smoker -- 0.25 packs/day    Types: Cigarettes  . Smokeless tobacco: Never Used  . Alcohol Use: No    OB History   Grav Para Term Preterm Abortions TAB SAB Ect Mult Living   1 1 1       1       Review of Systems  Constitutional: Negative for appetite change and fatigue.  HENT: Negative for congestion, sinus pressure and ear discharge.   Eyes: Negative for discharge.  Respiratory: Positive for cough, shortness of breath and wheezing.   Cardiovascular: Negative for chest pain.  Gastrointestinal: Negative for abdominal pain and diarrhea.  Genitourinary: Negative for frequency and hematuria.  Musculoskeletal: Negative for back pain.  Skin: Negative for rash.  Neurological: Negative for seizures and headaches.  Psychiatric/Behavioral: Negative for hallucinations.    Allergies  Shellfish allergy and Eggs or egg-derived products  Home Medications   Current Outpatient Rx  Name  Route  Sig  Dispense  Refill  . albuterol (PROVENTIL HFA;VENTOLIN HFA) 108 (90 BASE) MCG/ACT inhaler   Inhalation   Inhale 2 puffs into the lungs every 6 (six) hours as needed for wheezing or shortness of breath.         . metFORMIN (GLUCOPHAGE) 500 MG tablet   Oral   Take  500 mg by mouth daily with breakfast.          . albuterol (PROVENTIL HFA;VENTOLIN HFA) 108 (90 BASE) MCG/ACT inhaler   Inhalation   Inhale 1-2 puffs into the lungs every 6 (six) hours as needed for wheezing.   1 Inhaler   0     BP 125/77  Pulse 101  Temp(Src) 98.5 F (36.9 C) (Oral)  Resp 20  Ht 5\' 3"  (1.6 m)  Wt 131 lb 4 oz (59.535 kg)  BMI 23.26 kg/m2  SpO2 94%  Physical Exam  Constitutional: She is oriented to person, place, and time. She appears well-developed.  HENT:  Head: Normocephalic.  Eyes: Conjunctivae and EOM are normal. No scleral icterus.  Neck: Neck supple. No thyromegaly present.  Cardiovascular:  Normal rate and regular rhythm.  Exam reveals no gallop and no friction rub.   No murmur heard. Pulmonary/Chest: No stridor. She has wheezes. She has no rales. She exhibits no tenderness.  Abdominal: She exhibits no distension. There is no tenderness. There is no rebound.  Musculoskeletal: Normal range of motion. She exhibits no edema.  Lymphadenopathy:    She has no cervical adenopathy.  Neurological: She is oriented to person, place, and time. Coordination normal.  Skin: No rash noted. No erythema.  Psychiatric: She has a normal mood and affect. Her behavior is normal.    ED Course  Procedures (including critical care time)  Labs Reviewed  CBC WITH DIFFERENTIAL - Abnormal; Notable for the following:    RBC 5.14 (*)    Hemoglobin 16.7 (*)    MCHC 36.9 (*)    All other components within normal limits  COMPREHENSIVE METABOLIC PANEL - Abnormal; Notable for the following:    Glucose, Bld 208 (*)    ALT 42 (*)    Alkaline Phosphatase 179 (*)    Total Bilirubin 0.2 (*)    All other components within normal limits  D-DIMER, QUANTITATIVE - Abnormal; Notable for the following:    D-Dimer, Quant 0.80 (*)    All other components within normal limits   Dg Chest [facility] 1 View  04/28/2013   *RADIOLOGY REPORT*  Clinical Data: Shortness of breath, cough  PORTABLE CHEST - 1 VIEW  Comparison: 03/20/2010  Findings: Lungs are clear. No pleural effusion or pneumothorax.  Cardiomediastinal silhouette is within normal limits.  IMPRESSION: No evidence of acute cardiopulmonary disease.   Original Report Authenticated By: Charline Bills, M.D.     1. Bronchospasm     Pt improved with tx and does not want a ct scan  MDM         Benny Lennert, MD 04/28/13 [phone]

## 2013-04-28 NOTE — ED Notes (Signed)
Pt states she began having wheezing and SOB last night @ 19:00.  Pt also endorses cough productive of white sputum.  Pt denies N/V/D and has not measured her temperature at home.  Pt is out of her albuterol inhaler.  Pt as inspiratory wheezing in triage, but is in NAD.

## 2014-04-28 ENCOUNTER — Emergency Department (INDEPENDENT_AMBULATORY_CARE_PROVIDER_SITE_OTHER)
Admission: EM | Admit: 2014-04-28 | Discharge: 2014-04-28 | Disposition: A | Discharge status: Home / Self Care | Payer: 59 | Source: Home / Self Care

## 2014-04-28 ENCOUNTER — Encounter (HOSPITAL_COMMUNITY): Payer: Self-pay | Admitting: Emergency Medicine

## 2014-04-28 DIAGNOSIS — J45909 Unspecified asthma, uncomplicated: Secondary | ICD-10-CM

## 2014-04-28 DIAGNOSIS — F17200 Nicotine dependence, unspecified, uncomplicated: Secondary | ICD-10-CM

## 2014-04-28 DIAGNOSIS — J309 Allergic rhinitis, unspecified: Secondary | ICD-10-CM

## 2014-04-28 DIAGNOSIS — Z720 Tobacco use: Secondary | ICD-10-CM

## 2014-04-28 MED ORDER — GUAIFENESIN-CODEINE 100-10 MG/5ML PO SYRP: 5.0000 mL | ORAL_SOLUTION | ORAL | Status: DC | PRN

## 2014-04-28 MED ORDER — PREDNISONE 20 MG PO TABS: ORAL_TABLET | ORAL | Status: DC

## 2014-04-28 NOTE — ED Provider Notes (Signed)
Medical screening examination/treatment/procedure(s) were performed by resident physician or non-physician practitioner and as supervising physician I was immediately available for consultation/collaboration.   Denise Washburn DOUGLAS MD.   Nickson Middlesworth D Jorgeluis Gurganus, MD 04/28/14 1319 

## 2014-04-28 NOTE — ED Provider Notes (Signed)
CSN: [id number]     Arrival date & time 04/28/14  1052 History   First MD Initiated Contact with Patient 04/28/14 1140     Chief Complaint  Patient presents with  . Cough  . Emesis   (Consider location/radiation/quality/duration/timing/severity/associated sxs/prior Treatment) HPI Comments: 4 d ago had itchy throat f/b PND, change in voice, PND and cough. Has been using her albuterol HFA. Hx asthma and smoking. No fevers. Occasionally cough and blowing nose with specks of bld.   Past Medical History  Diagnosis Date  . Diabetes mellitus     TYPE II  . Asthma    Past Surgical History  Procedure Laterality Date  . Cholecystectomy    . Cesarean section     Family History  Problem Relation Age of Onset  . Diabetes Maternal Grandmother   . Breast cancer Maternal Grandmother   . Hypertension Mother   . Diabetes Mother   . Kidney failure Mother   . Hypertension Father   . Diabetes Father   . Stroke Father   . Hypertension Maternal Aunt   . Diabetes Maternal Aunt   . Cancer Maternal Aunt     throat  . Hypertension Maternal Uncle   . Diabetes Maternal Uncle   . Hypertension Paternal Aunt   . Diabetes Paternal Aunt   . Hypertension Paternal Uncle   . Diabetes Paternal Uncle    History  Substance Use Topics  . Smoking status: Current Every Day Smoker -- 0.25 packs/day    Types: Cigarettes  . Smokeless tobacco: Never Used  . Alcohol Use: No   OB History   Grav Para Term Preterm Abortions TAB SAB Ect Mult Living   1 1 1       1      Review of Systems  Constitutional: Positive for activity change. Negative for fever, chills, appetite change and fatigue.  HENT: Positive for congestion, postnasal drip, rhinorrhea and sore throat. Negative for facial swelling and sinus pressure.   Eyes: Negative.   Respiratory: Positive for cough, shortness of breath and wheezing.   Cardiovascular: Negative.   Gastrointestinal: Negative.   Musculoskeletal: Negative for neck pain and neck  stiffness.  Skin: Negative for pallor and rash.  Neurological: Positive for light-headedness.    Allergies  Shellfish allergy and Eggs or egg-derived products  Home Medications   Prior to Admission medications   Medication Sig Start Date End Date Taking? Authorizing Provider  albuterol (PROVENTIL HFA;VENTOLIN HFA) 108 (90 BASE) MCG/ACT inhaler Inhale 2 puffs into the lungs every 6 (six) hours as needed for wheezing or shortness of breath.   Yes Historical Provider, MD  metFORMIN (GLUCOPHAGE) 500 MG tablet Take 500 mg by mouth daily with breakfast.    Yes Historical Provider, MD  albuterol (PROVENTIL HFA;VENTOLIN HFA) 108 (90 BASE) MCG/ACT inhaler Inhale 1-2 puffs into the lungs every 6 (six) hours as needed for wheezing. 04/28/13   Benny LennertJoseph L Zammit, MD   BP 126/85  Pulse 94  Temp(Src) 97.7 F (36.5 C) (Oral)  Resp 12  SpO2 98% Physical Exam  Nursing note and vitals reviewed. Constitutional: She is oriented to person, place, and time. She appears well-developed and well-nourished. No distress.  HENT:  Right Ear: External ear normal.  Left Ear: External ear normal.  Mouth/Throat: No oropharyngeal exudate.  OP with erythema, streaky. Clear PND.  Nasal septum/turbinates erythematous and boggy. TM's nl   Eyes: Conjunctivae and EOM are normal.  Neck: Normal range of motion. Neck supple.  Cardiovascular: Normal rate,  regular rhythm and normal heart sounds.   Pulmonary/Chest: Effort normal and breath sounds normal. No respiratory distress. She has no wheezes. She has no rales.  Lungs with good air movement and no wheezes.   Musculoskeletal: Normal range of motion. She exhibits no edema.  Lymphadenopathy:    She has no cervical adenopathy.  Neurological: She is alert and oriented to person, place, and time.  Skin: Skin is warm and dry. No rash noted.  Psychiatric: She has a normal mood and affect.    ED Course  Procedures (including critical care time) Labs Review Labs Reviewed  - No data to display  Imaging Review No results found.   MDM   1. Allergic rhinitis   2. Asthma   3. Tobacco abuse disorder     claritin or allegra Saline nasal spray, sudafed PE Lots of fluids  Stop smoking Cont alb prn breathing. CHeratussin AC     Hayden Rasmussen, NP 04/28/14 1225

## 2014-04-28 NOTE — ED Notes (Signed)
C/o  Productive cough with green sputum.  Fatigue.  Dizzy.  Body aches.  Chills.  Scratchy throat.  Low grade temp.   Since Thursday.    Vomiting on set las night.  Woke with vomiting this a.m.   No otc meds taken for symptoms.    Mild relief with albuterol inhaler for cough.

## 2014-04-28 NOTE — Discharge Instructions (Signed)
Allergic Rhinitis Allegra or claritin daily Sudafed PE 10 mg if needed for congestion Lots of nasal saline Drink plenty of fluids Allergic rhinitis is when the mucous membranes in the nose respond to allergens. Allergens are particles in the air that cause your body to have an allergic reaction. This causes you to release allergic antibodies. Through a chain of events, these eventually cause you to release histamine into the blood stream. Although meant to protect the body, it is this release of histamine that causes your discomfort, such as frequent sneezing, congestion, and an itchy, runny nose.  CAUSES  Seasonal allergic rhinitis (hay fever) is caused by pollen allergens that may come from grasses, trees, and weeds. Year-round allergic rhinitis (perennial allergic rhinitis) is caused by allergens such as house dust mites, pet dander, and mold spores.  SYMPTOMS   Nasal stuffiness (congestion).  Itchy, runny nose with sneezing and tearing of the eyes. DIAGNOSIS  Your health care provider can help you determine the allergen or allergens that trigger your symptoms. If you and your health care provider are unable to determine the allergen, skin or blood testing may be used. TREATMENT  Allergic Rhinitis does not have a cure, but it can be controlled by:  Medicines and allergy shots (immunotherapy).  Avoiding the allergen. Hay fever may often be treated with antihistamines in pill or nasal spray forms. Antihistamines block the effects of histamine. There are over-the-counter medicines that may help with nasal congestion and swelling around the eyes. Check with your health care provider before taking or giving this medicine.  If avoiding the allergen or the medicine prescribed do not work, there are many new medicines your health care provider can prescribe. Stronger medicine may be used if initial measures are ineffective. Desensitizing injections can be used if medicine and avoidance does not  work. Desensitization is when a patient is given ongoing shots until the body becomes less sensitive to the allergen. Make sure you follow up with your health care provider if problems continue. HOME CARE INSTRUCTIONS It is not possible to completely avoid allergens, but you can reduce your symptoms by taking steps to limit your exposure to them. It helps to know exactly what you are allergic to so that you can avoid your specific triggers. SEEK MEDICAL CARE IF:   You have a fever.  You develop a cough that does not stop easily (persistent).  You have shortness of breath.  You start wheezing.  Symptoms interfere with normal daily activities. Document Released: 08/16/2001 Document Revised: 09/11/2013 Document Reviewed: 07/29/2013 [facility] Patient Information 2014 [place], [place].  Asthma Attack Prevention Although there is no way to prevent asthma from starting, you can take steps to control the disease and reduce its symptoms. Learn about your asthma and how to control it. Take an active role to control your asthma by working with your health care provider to create and follow an asthma action plan. An asthma action plan guides you in:  Taking your medicines properly.  Avoiding things that set off your asthma or make your asthma worse (asthma triggers).  Tracking your level of asthma control.  Responding to worsening asthma.  Seeking emergency care when needed. To track your asthma, keep records of your symptoms, check your peak flow number using a handheld device that shows how well air moves out of your lungs (peak flow meter), and get regular asthma checkups.  WHAT ARE SOME WAYS TO PREVENT AN ASTHMA ATTACK?  Take medicines as directed by your health care  provider.  Keep track of your asthma symptoms and level of control.  With your health care provider, write a detailed plan for taking medicines and managing an asthma attack. Then be sure to follow your action plan. Asthma is  an ongoing condition that needs regular monitoring and treatment.  Identify and avoid asthma triggers. Many outdoor allergens and irritants (such as pollen, mold, cold air, and air pollution) can trigger asthma attacks. Find out what your asthma triggers are and take steps to avoid them.  Monitor your breathing. Learn to recognize warning signs of an attack, such as coughing, wheezing, or shortness of breath. Your lung function may decrease before you notice any signs or symptoms, so regularly measure and record your peak airflow with a home peak flow meter.  Identify and treat attacks early. If you act quickly, you are less likely to have a severe attack. You will also need less medicine to control your symptoms. When your peak flow measurements decrease and alert you to an upcoming attack, take your medicine as instructed and immediately stop any activity that may have triggered the attack. If your symptoms do not improve, get medical help.  Pay attention to increasing quick-relief inhaler use. If you find yourself relying on your quick-relief inhaler, your asthma is not under control. See your health care provider about adjusting your treatment. WHAT CAN MAKE MY SYMPTOMS WORSE? A number of common things can set off or make your asthma symptoms worse and cause temporary increased inflammation of your airways. Keep track of your asthma symptoms for several weeks, detailing all the environmental and emotional factors that are linked with your asthma. When you have an asthma attack, go back to your asthma diary to see which factor, or combination of factors, might have contributed to it. Once you know what these factors are, you can take steps to control many of them. If you have allergies and asthma, it is important to take asthma prevention steps at home. Minimizing contact with the substance to which you are allergic will help prevent an asthma attack. Some triggers and ways to avoid these triggers  are: Animal Dander:  Some people are allergic to the flakes of skin or dried saliva from animals with fur or feathers.   There is no such thing as a hypoallergenic dog or cat breed. All dogs or cats can cause allergies, even if they don't shed.  Keep these pets out of your home.  If you are not able to keep a pet outdoors, keep the pet out of your bedroom and other sleeping areas at all times, and keep the door closed.  Remove carpets and furniture covered with cloth from your home. If that is not possible, keep the pet away from fabric-covered furniture and carpets. Dust Mites: Many people with asthma are allergic to dust mites. Dust mites are tiny bugs that are found in every home in mattresses, pillows, carpets, fabric-covered furniture, bedcovers, clothes, stuffed toys, and other fabric-covered items.   Cover your mattress in a special dust-proof cover.  Cover your pillow in a special dust-proof cover, or wash the pillow each week in hot water. Water must be hotter than 130 F (54.4 C) to kill dust mites. Cold or warm water used with detergent and bleach can also be effective.  Wash the sheets and blankets on your bed each week in hot water.  Try not to sleep or lie on cloth-covered cushions.  Call ahead when traveling and ask for a smoke-free hotel  room. Bring your own bedding and pillows in case the hotel only supplies feather pillows and down comforters, which may contain dust mites and cause asthma symptoms.  Remove carpets from your bedroom and those laid on concrete, if you can.  Keep stuffed toys out of the bed, or wash the toys weekly in hot water or cooler water with detergent and bleach. Cockroaches: Many people with asthma are allergic to the droppings and remains of cockroaches.   Keep food and garbage in closed containers. Never leave food out.  Use poison baits, traps, powders, gels, or paste (for example, boric acid).  If a spray is used to kill cockroaches, stay  out of the room until the odor goes away. Indoor Mold:  Fix leaky faucets, pipes, or other sources of water that have mold around them.  Clean floors and moldy surfaces with a fungicide or diluted bleach.  Avoid using humidifiers, vaporizers, or swamp coolers. These can spread molds through the air. Pollen and Outdoor Mold:  When pollen or mold spore counts are high, try to keep your windows closed.  Stay indoors with windows closed from late morning to afternoon. Pollen and some mold spore counts are highest at that time.  Ask your health care provider whether you need to take anti-inflammatory medicine or increase your dose of the medicine before your allergy season starts. Other Irritants to Avoid:  Tobacco smoke is an irritant. If you smoke, ask your health care provider how you can quit. Ask family members to quit smoking too. Do not allow smoking in your home or car.  If possible, do not use a wood-burning stove, kerosene heater, or fireplace. Minimize exposure to all sources of smoke, including to incense, candles, fires, and fireworks.  Try to stay away from strong odors and sprays, such as perfume, talcum powder, hair spray, and paints.  Decrease humidity in your home and use an indoor air cleaning device. Reduce indoor humidity to below 60%. Dehumidifiers or central air conditioners can do this.  Decrease house dust exposure by changing furnace and air cooler filters frequently.  Try to have someone else vacuum for you once or twice a week. Stay out of rooms while they are being vacuumed and for a short while afterward.  If you vacuum, use a dust mask from a hardware store, a double-layered or microfilter vacuum cleaner bag, or a vacuum cleaner with a HEPA filter.  Sulfites in foods and beverages can be irritants. Do not drink beer or wine or eat dried fruit, processed potatoes, or shrimp if they cause asthma symptoms.  Cold air can trigger an asthma attack. Cover your nose  and mouth with a scarf on cold or windy days.  Several health conditions can make asthma more difficult to manage, including a runny nose, sinus infections, reflux disease, psychological stress, and sleep apnea. Work with your health care provider to manage these conditions.  Avoid close contact with people who have a respiratory infection such as a cold or the flu, since your asthma symptoms may get worse if you catch the infection. Wash your hands thoroughly after touching items that may have been handled by people with a respiratory infection.  Get a flu shot every year to protect against the flu virus, which often makes asthma worse for days or weeks. Also get a pneumonia shot if you have not previously had one. Unlike the flu shot, the pneumonia shot does not need to be given yearly. Medicines:  Talk to your health  care provider about whether it is safe for you to take aspirin or non-steroidal anti-inflammatory medicines (NSAIDs). In a small number of people with asthma, aspirin and NSAIDs can cause asthma attacks. These medicines must be avoided by people who have known aspirin-sensitive asthma. It is important that people with aspirin-sensitive asthma read labels of all over-the-counter medicines used to treat pain, colds, coughs, and fever.  Beta blockers and ACE inhibitors are other medicines you should discuss with your health care provider. HOW CAN I FIND OUT WHAT I AM ALLERGIC TO? Ask your asthma health care provider about allergy skin testing or blood testing (the RAST test) to identify the allergens to which you are sensitive. If you are found to have allergies, the most important thing to do is to try to avoid exposure to any allergens that you are sensitive to as much as possible. Other treatments for allergies, such as medicines and allergy shots (immunotherapy) are available.  CAN I EXERCISE? Follow your health care provider's advice regarding asthma treatment before exercising. It  is important to maintain a regular exercise program, but vigorous exercise, or exercise in cold, humid, or dry environments can cause asthma attacks, especially for those people who have exercise-induced asthma. Document Released: 11/09/2009 Document Revised: 07/24/2013 Document Reviewed: 05/29/2013 [facility] Patient Information 2014 [place], [place].  Smoking Cessation Quitting smoking is important to your health and has many advantages. However, it is not always easy to quit since nicotine is a very addictive drug. Often times, people try 3 times or more before being able to quit. This document explains the best ways for you to prepare to quit smoking. Quitting takes hard work and a lot of effort, but you can do it. ADVANTAGES OF QUITTING SMOKING  You will live longer, feel better, and live better.  Your body will feel the impact of quitting smoking almost immediately.  Within 20 minutes, blood pressure decreases. Your pulse returns to its normal level.  After 8 hours, carbon monoxide levels in the blood return to normal. Your oxygen level increases.  After 24 hours, the chance of having a heart attack starts to decrease. Your breath, hair, and body stop smelling like smoke.  After 48 hours, damaged nerve endings begin to recover. Your sense of taste and smell improve.  After 72 hours, the body is virtually free of nicotine. Your bronchial tubes relax and breathing becomes easier.  After 2 to 12 weeks, lungs can hold more air. Exercise becomes easier and circulation improves.  The risk of having a heart attack, stroke, cancer, or lung disease is greatly reduced.  After 1 year, the risk of coronary heart disease is cut in half.  After 5 years, the risk of stroke falls to the same as a nonsmoker.  After 10 years, the risk of lung cancer is cut in half and the risk of other cancers decreases significantly.  After 15 years, the risk of coronary heart disease drops, usually to the level of  a nonsmoker.  If you are pregnant, quitting smoking will improve your chances of having a healthy baby.  The people you live with, especially any children, will be healthier.  You will have extra money to spend on things other than cigarettes. QUESTIONS TO THINK ABOUT BEFORE ATTEMPTING TO QUIT You may want to talk about your answers with your caregiver.  Why do you want to quit?  If you tried to quit in the past, what helped and what did not?  What will be the most  difficult situations for you after you quit? How will you plan to handle them?  Who can help you through the tough times? Your family? Friends? A caregiver?  What pleasures do you get from smoking? What ways can you still get pleasure if you quit? Here are some questions to ask your caregiver:  How can you help me to be successful at quitting?  What medicine do you think would be best for me and how should I take it?  What should I do if I need more help?  What is smoking withdrawal like? How can I get information on withdrawal? GET READY  Set a quit date.  Change your environment by getting rid of all cigarettes, ashtrays, matches, and lighters in your home, car, or work. Do not let people smoke in your home.  Review your past attempts to quit. Think about what worked and what did not. GET SUPPORT AND ENCOURAGEMENT You have a better chance of being successful if you have help. You can get support in many ways.  Tell your family, friends, and co-workers that you are going to quit and need their support. Ask them not to smoke around you.  Get individual, group, or telephone counseling and support. Programs are available at [employer] and health centers. Call your local health department for information about programs in your area.  Spiritual beliefs and practices may help some smokers quit.  Download a "quit meter" on your computer to keep track of quit statistics, such as how long you have gone without  smoking, cigarettes not smoked, and money saved.  Get a self-help book about quitting smoking and staying off of tobacco. LEARN NEW SKILLS AND BEHAVIORS  Distract yourself from urges to smoke. Talk to someone, go for a walk, or occupy your time with a task.  Change your normal routine. Take a different route to work. Drink tea instead of coffee. Eat breakfast in a different place.  Reduce your stress. Take a hot bath, exercise, or read a book.  Plan something enjoyable to do every day. Reward yourself for not smoking.  Explore interactive web-based programs that specialize in helping you quit. GET MEDICINE AND USE IT CORRECTLY Medicines can help you stop smoking and decrease the urge to smoke. Combining medicine with the above behavioral methods and support can greatly increase your chances of successfully quitting smoking.  Nicotine replacement therapy helps deliver nicotine to your body without the negative effects and risks of smoking. Nicotine replacement therapy includes nicotine gum, lozenges, inhalers, nasal sprays, and skin patches. Some may be available over-the-counter and others require a prescription.  Antidepressant medicine helps people abstain from smoking, but how this works is unknown. This medicine is available by prescription.  Nicotinic receptor partial agonist medicine simulates the effect of nicotine in your brain. This medicine is available by prescription. Ask your caregiver for advice about which medicines to use and how to use them based on your health history. Your caregiver will tell you what side effects to look out for if you choose to be on a medicine or therapy. Carefully read the information on the package. Do not use any other product containing nicotine while using a nicotine replacement product.  RELAPSE OR DIFFICULT SITUATIONS Most relapses occur within the first 3 months after quitting. Do not be discouraged if you start smoking again. Remember, most  people try several times before finally quitting. You may have symptoms of withdrawal because your body is used to nicotine. You may crave  cigarettes, be irritable, feel very hungry, cough often, get headaches, or have difficulty concentrating. The withdrawal symptoms are only temporary. They are strongest when you first quit, but they will go away within 10 14 days. To reduce the chances of relapse, try to:  Avoid drinking alcohol. Drinking lowers your chances of successfully quitting.  Reduce the amount of caffeine you consume. Once you quit smoking, the amount of caffeine in your body increases and can give you symptoms, such as a rapid heartbeat, sweating, and anxiety.  Avoid smokers because they can make you want to smoke.  Do not let weight gain distract you. Many smokers will gain weight when they quit, usually less than 10 pounds. Eat a healthy diet and stay active. You can always lose the weight gained after you quit.  Find ways to improve your mood other than smoking. FOR MORE INFORMATION  www.smokefree.gov  Document Released: 11/15/2001 Document Revised: 05/22/2012 Document Reviewed: 03/01/2012 [facility] Patient Information 2014 [place], [place].

## 2014-10-06 ENCOUNTER — Encounter (HOSPITAL_COMMUNITY): Payer: Self-pay | Admitting: Emergency Medicine

## 2014-12-31 ENCOUNTER — Encounter (HOSPITAL_COMMUNITY): Payer: Self-pay

## 2014-12-31 ENCOUNTER — Emergency Department (HOSPITAL_COMMUNITY): Payer: Managed Care, Other (non HMO)

## 2014-12-31 DIAGNOSIS — Z9102 Food additives allergy status: Secondary | ICD-10-CM

## 2014-12-31 DIAGNOSIS — A419 Sepsis, unspecified organism: Principal | ICD-10-CM | POA: Diagnosis present

## 2014-12-31 DIAGNOSIS — Z882 Allergy status to sulfonamides status: Secondary | ICD-10-CM

## 2014-12-31 DIAGNOSIS — J189 Pneumonia, unspecified organism: Secondary | ICD-10-CM | POA: Diagnosis present

## 2014-12-31 DIAGNOSIS — F17210 Nicotine dependence, cigarettes, uncomplicated: Secondary | ICD-10-CM | POA: Diagnosis present

## 2014-12-31 DIAGNOSIS — Z9119 Patient's noncompliance with other medical treatment and regimen: Secondary | ICD-10-CM | POA: Diagnosis present

## 2014-12-31 DIAGNOSIS — J45909 Unspecified asthma, uncomplicated: Secondary | ICD-10-CM | POA: Diagnosis present

## 2014-12-31 DIAGNOSIS — E1165 Type 2 diabetes mellitus with hyperglycemia: Secondary | ICD-10-CM | POA: Diagnosis present

## 2014-12-31 DIAGNOSIS — R0602 Shortness of breath: Secondary | ICD-10-CM | POA: Diagnosis not present

## 2014-12-31 DIAGNOSIS — Z9049 Acquired absence of other specified parts of digestive tract: Secondary | ICD-10-CM | POA: Diagnosis present

## 2014-12-31 NOTE — ED Notes (Signed)
Pt reporting shortness of breath and generalized body aches since Monday.  Pt has taken OTC medication with no relief.

## 2015-01-01 ENCOUNTER — Inpatient Hospital Stay (HOSPITAL_COMMUNITY)
Admission: EM | Admit: 2015-01-01 | Discharge: 2015-01-02 | DRG: 871 | Disposition: A | Discharge status: Home / Self Care | Payer: Managed Care, Other (non HMO) | Attending: Internal Medicine | Admitting: Internal Medicine

## 2015-01-01 ENCOUNTER — Inpatient Hospital Stay (HOSPITAL_COMMUNITY): Payer: Managed Care, Other (non HMO)

## 2015-01-01 ENCOUNTER — Emergency Department (HOSPITAL_COMMUNITY): Payer: Managed Care, Other (non HMO)

## 2015-01-01 ENCOUNTER — Encounter (HOSPITAL_COMMUNITY): Payer: Self-pay | Admitting: General Practice

## 2015-01-01 DIAGNOSIS — R0600 Dyspnea, unspecified: Secondary | ICD-10-CM

## 2015-01-01 DIAGNOSIS — J45909 Unspecified asthma, uncomplicated: Secondary | ICD-10-CM | POA: Diagnosis present

## 2015-01-01 DIAGNOSIS — Z882 Allergy status to sulfonamides status: Secondary | ICD-10-CM | POA: Diagnosis not present

## 2015-01-01 DIAGNOSIS — R7989 Other specified abnormal findings of blood chemistry: Secondary | ICD-10-CM

## 2015-01-01 DIAGNOSIS — Z9119 Patient's noncompliance with other medical treatment and regimen: Secondary | ICD-10-CM | POA: Diagnosis present

## 2015-01-01 DIAGNOSIS — F17210 Nicotine dependence, cigarettes, uncomplicated: Secondary | ICD-10-CM

## 2015-01-01 DIAGNOSIS — R5081 Fever presenting with conditions classified elsewhere: Secondary | ICD-10-CM

## 2015-01-01 DIAGNOSIS — Z9102 Food additives allergy status: Secondary | ICD-10-CM | POA: Diagnosis not present

## 2015-01-01 DIAGNOSIS — J189 Pneumonia, unspecified organism: Secondary | ICD-10-CM | POA: Diagnosis present

## 2015-01-01 DIAGNOSIS — A419 Sepsis, unspecified organism: Secondary | ICD-10-CM | POA: Diagnosis present

## 2015-01-01 DIAGNOSIS — R000 Tachycardia, unspecified: Secondary | ICD-10-CM

## 2015-01-01 DIAGNOSIS — E1165 Type 2 diabetes mellitus with hyperglycemia: Secondary | ICD-10-CM

## 2015-01-01 DIAGNOSIS — R0602 Shortness of breath: Secondary | ICD-10-CM | POA: Diagnosis present

## 2015-01-01 DIAGNOSIS — Z9049 Acquired absence of other specified parts of digestive tract: Secondary | ICD-10-CM | POA: Diagnosis present

## 2015-01-01 HISTORY — DX: Pneumonia, unspecified organism: J18.9

## 2015-01-01 LAB — GLUCOSE, CAPILLARY
GLUCOSE-CAPILLARY: 154 mg/dL — AB (ref 70–99)
GLUCOSE-CAPILLARY: 206 mg/dL — AB (ref 70–99)
Glucose-Capillary: 124 mg/dL — ABNORMAL HIGH (ref 70–99)

## 2015-01-01 LAB — BASIC METABOLIC PANEL
Anion gap: 8 (ref 5–15)
BUN: 7 mg/dL (ref 6–23)
CHLORIDE: 102 mmol/L (ref 96–112)
CO2: 22 mmol/L (ref 19–32)
CREATININE: 0.76 mg/dL (ref 0.50–1.10)
Calcium: 8.9 mg/dL (ref 8.4–10.5)
GLUCOSE: 248 mg/dL — AB (ref 70–99)
POTASSIUM: 3.7 mmol/L (ref 3.5–5.1)
SODIUM: 132 mmol/L — AB (ref 135–145)

## 2015-01-01 LAB — URINALYSIS, ROUTINE W REFLEX MICROSCOPIC
Bilirubin Urine: NEGATIVE
Hgb urine dipstick: NEGATIVE
Ketones, ur: 15 mg/dL — AB
Leukocytes, UA: NEGATIVE
Nitrite: NEGATIVE
PH: 5.5 (ref 5.0–8.0)
Protein, ur: NEGATIVE mg/dL
SPECIFIC GRAVITY, URINE: 1.027 (ref 1.005–1.030)
UROBILINOGEN UA: 1 mg/dL (ref 0.0–1.0)

## 2015-01-01 LAB — CBC WITH DIFFERENTIAL/PLATELET
Basophils Absolute: 0 10*3/uL (ref 0.0–0.1)
Basophils Relative: 0 % (ref 0–1)
EOS ABS: 0.1 10*3/uL (ref 0.0–0.7)
Eosinophils Relative: 1 % (ref 0–5)
HCT: 41.1 % (ref 36.0–46.0)
Hemoglobin: 15 g/dL (ref 12.0–15.0)
LYMPHS PCT: 9 % — AB (ref 12–46)
Lymphs Abs: 0.6 10*3/uL — ABNORMAL LOW (ref 0.7–4.0)
MCH: 32.1 pg (ref 26.0–34.0)
MCHC: 36.5 g/dL — ABNORMAL HIGH (ref 30.0–36.0)
MCV: 88 fL (ref 78.0–100.0)
MONOS PCT: 10 % (ref 3–12)
Monocytes Absolute: 0.7 10*3/uL (ref 0.1–1.0)
NEUTROS ABS: 5.3 10*3/uL (ref 1.7–7.7)
Neutrophils Relative %: 80 % — ABNORMAL HIGH (ref 43–77)
Platelets: 145 10*3/uL — ABNORMAL LOW (ref 150–400)
RBC: 4.67 MIL/uL (ref 3.87–5.11)
RDW: 12.2 % (ref 11.5–15.5)
WBC: 6.7 10*3/uL (ref 4.0–10.5)

## 2015-01-01 LAB — INFLUENZA PANEL BY PCR (TYPE A & B)
H1N1FLUPCR: NOT DETECTED
INFLBPCR: NEGATIVE
Influenza A By PCR: NEGATIVE

## 2015-01-01 LAB — TROPONIN I: Troponin I: <0.03 ng/mL (ref <0.031)

## 2015-01-01 LAB — I-STAT CG4 LACTIC ACID, ED: LACTIC ACID, VENOUS: 1.12 mmol/L (ref 0.5–2.0)

## 2015-01-01 LAB — STREP PNEUMONIAE URINARY ANTIGEN: Strep Pneumo Urinary Antigen: NEGATIVE

## 2015-01-01 LAB — URINE MICROSCOPIC-ADD ON

## 2015-01-01 LAB — D-DIMER, QUANTITATIVE (NOT AT ARMC): D DIMER QUANT: 0.63 ug{FEU}/mL — AB (ref 0.00–0.48)

## 2015-01-01 LAB — POC URINE PREG, ED: PREG TEST UR: NEGATIVE

## 2015-01-01 LAB — BRAIN NATRIURETIC PEPTIDE: B Natriuretic Peptide: 9.2 pg/mL (ref 0.0–100.0)

## 2015-01-01 MED ORDER — DEXTROSE 5 % IV SOLN
500.0000 mg | Freq: Once | INTRAVENOUS | Status: AC
  Administered 2015-01-01: 500 mg via INTRAVENOUS
  Filled 2015-01-01: qty 500

## 2015-01-01 MED ORDER — SODIUM CHLORIDE 0.9 % IV SOLN
INTRAVENOUS | Status: AC
  Administered 2015-01-01 (×2): 125 mL/h via INTRAVENOUS
  Administered 2015-01-02: 03:00:00 via INTRAVENOUS

## 2015-01-01 MED ORDER — IPRATROPIUM BROMIDE 0.02 % IN SOLN
0.5000 mg | Freq: Once | RESPIRATORY_TRACT | Status: AC
  Administered 2015-01-01: 0.5 mg via RESPIRATORY_TRACT
  Filled 2015-01-01: qty 2.5

## 2015-01-01 MED ORDER — IBUPROFEN 600 MG PO TABS
600.0000 mg | ORAL_TABLET | Freq: Four times a day (QID) | ORAL | Status: DC | PRN
  Administered 2015-01-01 – 2015-01-02 (×2): 600 mg via ORAL
  Filled 2015-01-01 (×5): qty 1

## 2015-01-01 MED ORDER — ACETAMINOPHEN 325 MG PO TABS
650.0000 mg | ORAL_TABLET | Freq: Once | ORAL | Status: AC
  Administered 2015-01-01: 650 mg via ORAL
  Filled 2015-01-01: qty 2

## 2015-01-01 MED ORDER — HEPARIN SODIUM (PORCINE) 5000 UNIT/ML IJ SOLN
5000.0000 [IU] | Freq: Three times a day (TID) | INTRAMUSCULAR | Status: DC
  Administered 2015-01-01 (×2): 5000 [IU] via SUBCUTANEOUS
  Filled 2015-01-01 (×4): qty 1

## 2015-01-01 MED ORDER — DEXTROSE 5 % IV SOLN
500.0000 mg | Freq: Once | INTRAVENOUS | Status: DC
  Filled 2015-01-01: qty 500

## 2015-01-01 MED ORDER — ALBUTEROL SULFATE (2.5 MG/3ML) 0.083% IN NEBU
5.0000 mg | INHALATION_SOLUTION | Freq: Once | RESPIRATORY_TRACT | Status: AC
  Administered 2015-01-01: 5 mg via RESPIRATORY_TRACT
  Filled 2015-01-01: qty 6

## 2015-01-01 MED ORDER — GUAIFENESIN-CODEINE 100-10 MG/5ML PO SOLN
5.0000 mL | ORAL | Status: DC | PRN
  Administered 2015-01-02: 5 mL via ORAL
  Filled 2015-01-01 (×2): qty 5

## 2015-01-01 MED ORDER — GUAIFENESIN-DM 100-10 MG/5ML PO SYRP: 5.0000 mL | ORAL_SOLUTION | ORAL | Status: DC | PRN

## 2015-01-01 MED ORDER — INSULIN ASPART 100 UNIT/ML ~~LOC~~ SOLN
0.0000 [IU] | Freq: Three times a day (TID) | SUBCUTANEOUS | Status: DC
  Administered 2015-01-01: 3 [IU] via SUBCUTANEOUS
  Administered 2015-01-01: 2 [IU] via SUBCUTANEOUS
  Administered 2015-01-02: 1 [IU] via SUBCUTANEOUS

## 2015-01-01 MED ORDER — DEXTROSE 5 % IV SOLN
500.0000 mg | INTRAVENOUS | Status: DC
  Filled 2015-01-01: qty 500

## 2015-01-01 MED ORDER — SODIUM CHLORIDE 0.9 % IV SOLN
INTRAVENOUS | Status: DC
  Administered 2015-01-01: 150 mL/h via INTRAVENOUS

## 2015-01-01 MED ORDER — SODIUM CHLORIDE 0.9 % IV BOLUS (SEPSIS)
1000.0000 mL | Freq: Once | INTRAVENOUS | Status: AC
  Administered 2015-01-01: 1000 mL via INTRAVENOUS

## 2015-01-01 MED ORDER — IOHEXOL 350 MG/ML SOLN
80.0000 mL | Freq: Once | INTRAVENOUS | Status: AC | PRN
  Administered 2015-01-01: 80 mL via INTRAVENOUS

## 2015-01-01 MED ORDER — CEFTRIAXONE SODIUM IN DEXTROSE 20 MG/ML IV SOLN
1.0000 g | INTRAVENOUS | Status: DC
  Administered 2015-01-02: 1 g via INTRAVENOUS
  Filled 2015-01-01: qty 50

## 2015-01-01 MED ORDER — DEXTROSE 5 % IV SOLN
1.0000 g | Freq: Once | INTRAVENOUS | Status: AC
  Administered 2015-01-01: 1 g via INTRAVENOUS
  Filled 2015-01-01: qty 10

## 2015-01-01 NOTE — Progress Notes (Signed)
Utilization review completed. Adanya Sosinski, RN, BSN. 

## 2015-01-01 NOTE — H&P (Signed)
Date: 01/01/2015               Patient Name:  Bailey Stevenson MRN: [id number]  DOB: Apr 13, 1971 Age / Sex: 44 y.o., female   PCP: No primary care provider on file.         Medical Service: Internal Medicine Teaching Service         Attending Physician: Dr. Earl Lagos, MD    First Contact: Dr. Leatha Gilding  Pager: [phone]  Second Contact: Dr. Yetta Barre Pager: [phone]       After Hours (After 5p/  First Contact Pager: [phone]  weekends / holidays): Second Contact Pager: [phone]   Chief Complaint: SOB and myalgia  History of Present Illness: [name] is a 44 yo woman pmh DM untreated for the past year who presented with increasing SOB, myalgias, and fever for the past 3 days. The patient states she thinks all of this started back over Christmas break where she had developed a productive cough and myalgias but that only lasted about 2 days with complete resolution. Then this week she started having a sore throat, myalgias, productive cough and fever similar to the event in December. She tried some OTC sudaphed and mucinex without any relief. She works in Personnel officer at [employer] and has not received any flu vaccine and has been exposed to sick clients and a son who had URI symptoms as well. She has not had a PCP for over a year and is unaware of her CBGs at home and has not been taking any medication for her DM. She denied any CP, HA, abdominal pain, nausea/vomiting/diarrhea or anorexia, and no skin rashes. She is a 0.5ppd smoker and doesn't drink Etoh or use other drugs. She is married and has worked at [employer] for over 26 years.   Meds: Current Facility-Administered Medications  Medication Dose Route Frequency Provider Last Rate Last Dose  . azithromycin (ZITHROMAX) 500 mg in dextrose 5 % 250 mL IVPB  500 mg Intravenous Once Ankit Nanavati, MD      . cefTRIAXone (ROCEPHIN) 1 g in dextrose 5 % 50 mL IVPB  1 g Intravenous Once Derwood Kaplan, MD      . levonorgestrel (MIRENA)  20 MCG/24HR IUD   Intrauterine Once Ok Edwards, MD       Current Outpatient Prescriptions  Medication Sig Dispense Refill  . albuterol (PROVENTIL HFA;VENTOLIN HFA) 108 (90 BASE) MCG/ACT inhaler Inhale 2 puffs into the lungs every 6 (six) hours as needed for wheezing or shortness of breath.    . guaiFENesin (MUCINEX) 600 MG 12 hr tablet Take 1,200 mg by mouth 2 (two) times daily as needed for cough.    Marland Kitchen levonorgestrel (MIRENA) 20 MCG/24HR IUD 1 each by Intrauterine route once. March 2013    . PHENYLEPHRINE HCL PO Take 2 tablets by mouth every 4 (four) hours as needed (congestion).    Marland Kitchen albuterol (PROVENTIL HFA;VENTOLIN HFA) 108 (90 BASE) MCG/ACT inhaler Inhale 1-2 puffs into the lungs every 6 (six) hours as needed for wheezing. (Patient not taking: Reported on 12/31/2014) 1 Inhaler 0  . guaiFENesin-codeine (CHERATUSSIN AC) 100-10 MG/5ML syrup Take 5 mLs by mouth every 4 (four) hours as needed for cough or congestion. (Patient not taking: Reported on 12/31/2014) 120 mL 0  . predniSONE (DELTASONE) 20 MG tablet 2 tabs po once daily x 5 days. Take with food. (Patient not taking: Reported on 12/31/2014) 10 tablet 0    Allergies: Allergies as of 12/31/2014 -  Review Complete 12/31/2014  Allergen Reaction Noted  . Shellfish allergy Anaphylaxis 12/19/2011  . Eggs or egg-derived products Nausea And Vomiting 12/19/2011   Past Medical History  Diagnosis Date  . Diabetes mellitus     TYPE II  . Asthma    Past Surgical History  Procedure Laterality Date  . Cholecystectomy    . Cesarean section     Family History  Problem Relation Age of Onset  . Diabetes Maternal Grandmother   . Breast cancer Maternal Grandmother   . Hypertension Mother   . Diabetes Mother   . Kidney failure Mother   . Hypertension Father   . Diabetes Father   . Stroke Father   . Hypertension Maternal Aunt   . Diabetes Maternal Aunt   . Cancer Maternal Aunt     throat  . Hypertension Maternal Uncle   . Diabetes  Maternal Uncle   . Hypertension Paternal Aunt   . Diabetes Paternal Aunt   . Hypertension Paternal Uncle   . Diabetes Paternal Uncle    History   Social History  . Marital Status: Married    Spouse Name: N/A    Number of Children: N/A  . Years of Education: N/A   Occupational History  . Not on file.   Social History Main Topics  . Smoking status: Current Every Day Smoker -- 0.25 packs/day    Types: Cigarettes  . Smokeless tobacco: Never Used  . Alcohol Use: No  . Drug Use: No  . Sexual Activity: Yes    Birth Control/ Protection: IUD   Other Topics Concern  . Not on file   Social History Narrative    Review of Systems: Pertinent items are noted in HPI.  Physical Exam: Blood pressure 113/67, pulse 108, temperature 100.9 F (38.3 C), temperature source Oral, resp. rate 30, height  (1.575 m), weight 121 lb (54.885 kg), SpO2 96 %. General: resting in bed, NAD HEENT: PERRL, EOMI, no scleral icterus Cardiac: RRR, no rubs, murmurs or gallops Pulm: minimal wheezing throughout lung fields with some crackles in RLL, moving normal volumes of air Abd: soft, nontender, nondistended, BS present Ext: warm and well perfused, no pedal edema Neuro: alert and oriented X3, cranial nerves II-XII grossly intact  Lab results: Basic Metabolic Panel:  Recent Labs  16/10/96 0350  NA 132*  K 3.7  CL 102  CO2 22  GLUCOSE 248*  BUN 7  CREATININE 0.76  CALCIUM 8.9   CBC:  Recent Labs  01/01/15 0350  WBC 6.7  NEUTROABS 5.3  HGB 15.0  HCT 41.1  MCV 88.0  PLT 145*   Cardiac Enzymes:  Recent Labs  01/01/15 0350  TROPONINI <0.03   D-Dimer:  Recent Labs  01/01/15 0352  DDIMER 0.63*   Urinalysis:  Recent Labs  01/01/15 0610  COLORURINE YELLOW  LABSPEC 1.027  PHURINE 5.5  GLUCOSEU >1000*  HGBUR NEGATIVE  BILIRUBINUR NEGATIVE  KETONESUR 15*  PROTEINUR NEGATIVE  UROBILINOGEN 1.0  NITRITE NEGATIVE  LEUKOCYTESUR NEGATIVE   Imaging results:  Dg Chest  2 View  01/01/2015   CLINICAL DATA:  Cough for 2 weeks. Asthma. Shortness of breath. Generalized body aches.  EXAM: CHEST  2 VIEW  COMPARISON:  12/31/2014  FINDINGS: Increasing linear infiltration in the right lung base may represent early pneumonia. Heart size and pulmonary vascularity are normal. No blunting of costophrenic angles. No pneumothorax. Surgical clips in the right upper quadrant.  IMPRESSION: Increasing linear infiltration in the right lung base since prior  study may represent developing pneumonia.   Electronically Signed   By: Burman NievesWilliam  Stevens M.D.   On: 01/01/2015 06:55   Dg Chest 2 View  12/31/2014   CLINICAL DATA:  Initial evaluation for generalized body aches, shortness of breath.  EXAM: CHEST  2 VIEW  COMPARISON:  Prior radiograph from 04/28/2013.  FINDINGS: The cardiac and mediastinal silhouettes are stable in size and contour, and remain within normal limits.  The lungs are normally inflated. No airspace consolidation, pleural effusion, or pulmonary edema is identified. There is no pneumothorax.  No acute osseous abnormality identified. Cholecystectomy clips noted.  IMPRESSION: No active cardiopulmonary disease.   Electronically Signed   By: Rise MuBenjamin  McClintock M.D.   On: 12/31/2014 22:49    Other results: EKG: sinus tachycardia. RAD, s1q3t3  Assessment & Plan by Problem: 1. Sepsis 2/2 CAP: pt presents with tachycardia, tachypnea and fever meeting SIRS criteria. After IVF hydration repeat CXR was performed that showed signs of RLL PNA.  Lactic acid normal. Other infectious workup includingPt flu panel was negative and UA was negative. Pt Well's score 1.5 and modified Geneva score 5 which is moderate (both for HR) and D-dimer positive. EKG also concerning with RA deviation and s1q3t3 must rule out PE.   -BCx -IV ceftriaxone, Azithromycin -mucinex -robitussin DM -NS@150cc /hr x 12 hrs -CTA chest to r/o PE   2. DM: uncontrolled and unknown given lack of medical care.  -HgbA1c,  SSI   Dispo: Disposition is deferred at this time, awaiting improvement of current medical problems. Anticipated discharge in approximately 2-3 day(s).   The patient does not have a current PCP (No primary care provider on file.) and [facility] after [facility].  The patient does not have transportation limitations that hinder transportation to clinic appointments.  Signed: Christen BameNora Irby Fails, MD 01/01/2015, 8:07 AM

## 2015-01-01 NOTE — ED Notes (Signed)
Pt O2 sat 95% while ambulating on hallway.

## 2015-01-01 NOTE — ED Notes (Signed)
Ambulated patient with pulse ox. O2: 95%

## 2015-01-01 NOTE — ED Provider Notes (Addendum)
CSN: [id number]     Arrival date & time 12/31/14  2142 History      Chief Complaint  Patient presents with  . Generalized Body Aches  . Shortness of Breath    HPI Comments: 44 year old African American female presents with 5 day history of productive cough, rhinorrhea, sore throat, subjective fever, headache, and body aches. Patient has a history of asthma and diabetes for which she has not been receiving medication for over a year. Pain is 10/10, aching in character, throughout body, and not relieved with OTC analgesics. Cough has been productive with a thick sputum that is now reported to be green/ brown in color. Reports shortness of breath and dizziness. Denies nausea, vomiting, diarrhea. Pt also has exertional dib. She doesn't think she has been wheezing, and no breathing tx taken at home. Patient did not receive the flu vaccination this year and reports possible sick contact.  10 Systems reviewed and are negative for acute change except as noted in the HPI.    Patient is a 44 y.o. female presenting with shortness of breath. The history is provided by the patient. No language interpreter was used.  Shortness of Breath Associated symptoms: chest pain, cough and neck pain   Associated symptoms: no sore throat    HPI Comments:   Past Medical History  Diagnosis Date  . Diabetes mellitus     TYPE II  . Asthma    Past Surgical History  Procedure Laterality Date  . Cholecystectomy    . Cesarean section     Family History  Problem Relation Age of Onset  . Diabetes Maternal Grandmother   . Breast cancer Maternal Grandmother   . Hypertension Mother   . Diabetes Mother   . Kidney failure Mother   . Hypertension Father   . Diabetes Father   . Stroke Father   . Hypertension Maternal Aunt   . Diabetes Maternal Aunt   . Cancer Maternal Aunt     throat  . Hypertension Maternal Uncle   . Diabetes Maternal Uncle   . Hypertension Paternal Aunt   . Diabetes Paternal Aunt   .  Hypertension Paternal Uncle   . Diabetes Paternal Uncle    History  Substance Use Topics  . Smoking status: Current Every Day Smoker -- 0.25 packs/day    Types: Cigarettes  . Smokeless tobacco: Never Used  . Alcohol Use: No   OB History    Gravida Para Term Preterm AB TAB SAB Ectopic Multiple Living   Review of Systems  Constitutional: Positive for fatigue.  HENT: Positive for congestion and rhinorrhea. Negative for sore throat.   Respiratory: Positive for cough and shortness of breath.   Cardiovascular: Positive for chest pain.  Musculoskeletal: Positive for myalgias, arthralgias and neck pain. Negative for neck stiffness.  Neurological: Positive for light-headedness.      Allergies  Shellfish allergy and Eggs or egg-derived products  Home Medications   Prior to Admission medications   Medication Sig Start Date End Date Taking? Authorizing Provider  albuterol (PROVENTIL HFA;VENTOLIN HFA) 108 (90 BASE) MCG/ACT inhaler Inhale 2 puffs into the lungs every 6 (six) hours as needed for wheezing or shortness of breath.   Yes Historical Provider, MD  guaiFENesin (MUCINEX) 600 MG 12 hr tablet Take 1,200 mg by mouth 2 (two) times daily as needed for cough.   Yes Historical Provider, MD  levonorgestrel (MIRENA) 20 MCG/24HR  IUD 1 each by Intrauterine route once. March 2013   Yes Historical Provider, MD  PHENYLEPHRINE HCL PO Take 2 tablets by mouth every 4 (four) hours as needed (congestion).   Yes Historical Provider, MD  albuterol (PROVENTIL HFA;VENTOLIN HFA) 108 (90 BASE) MCG/ACT inhaler Inhale 1-2 puffs into the lungs every 6 (six) hours as needed for wheezing. Patient not taking: Reported on 12/31/2014 04/28/13   Benny LennertJoseph L Zammit, MD  guaiFENesin-codeine [facility](CHERATUSSIN AC) 100-10 MG/5ML syrup Take 5 mLs by mouth every 4 (four) hours as needed for cough or congestion. Patient not taking: Reported on 12/31/2014 04/28/14   Hayden Rasmussenavid Mabe, NP  predniSONE (DELTASONE) 20 MG tablet  2 tabs po once daily x 5 days. Take with food. Patient not taking: Reported on 12/31/2014 04/28/14   Hayden Rasmussenavid Mabe, NP   BP 118/66 mmHg  Pulse 115  Temp(Src) 100.9 F (38.3 C) (Oral)  Resp 20  Ht 5\' 2"  (1.575 m)  Wt 121 lb (54.885 kg)  BMI 22.13 kg/m2  SpO2 96% Physical Exam  Constitutional: She is oriented to person, place, and time. She appears well-developed and well-nourished. No distress.  HENT:  Head: Normocephalic and atraumatic.  Right Ear: Tympanic membrane normal.  Left Ear: Tympanic membrane normal.  Mouth/Throat: Oropharynx is clear and moist. No oropharyngeal exudate. Posterior oropharyngeal erythema: mild.  No tonsillar enlargement  Eyes: Conjunctivae are normal. Right eye exhibits no discharge. Left eye exhibits no discharge.  Neck: Neck supple. No rigidity.  Cardiovascular: Regular rhythm and normal heart sounds.  Tachycardia present.  Exam reveals no gallop and no friction rub.   No murmur heard. Pulmonary/Chest: Effort normal and breath sounds normal. No respiratory distress. She has no wheezes. She has no rhonchi. She has no rales.  Abdominal: Soft. She exhibits no distension. There is no tenderness.  Musculoskeletal: She exhibits no edema or tenderness.  Lymphadenopathy:       Head (right side): Preauricular (mild) adenopathy present.       Head (left side): Preauricular (mild) adenopathy present.    She has cervical adenopathy (mild).  Neurological: She is alert and oriented to person, place, and time.  Skin: Skin is warm and dry.  Psychiatric: She has a normal mood and affect. Her behavior is normal. Thought content normal.  Nursing note and vitals reviewed.   ED Course  Procedures (including critical care time) DIAGNOSTIC STUDIES: Oxygen Saturation is 96% on room air, normal by my interpretation.    COORDINATION OF CARE: 2:16 AM Discussed treatment plan with patient at beside, the patient agrees with the plan and has no further questions at this time.    Labs Review Labs Reviewed  CBC WITH DIFFERENTIAL/PLATELET - Abnormal; Notable for the following:    MCHC 36.5 (*)    Platelets 145 (*)    Neutrophils Relative % 80 (*)    Lymphocytes Relative 9 (*)    Lymphs Abs 0.6 (*)    All other components within normal limits  BASIC METABOLIC PANEL - Abnormal; Notable for the following:    Sodium 132 (*)    Glucose, Bld 248 (*)    All other components within normal limits  D-DIMER, QUANTITATIVE - Abnormal; Notable for the following:    D-Dimer, Quant 0.63 (*)    All other components within normal limits  BRAIN NATRIURETIC PEPTIDE  TROPONIN I  INFLUENZA PANEL BY PCR (TYPE A & B, H1N1)  URINALYSIS, ROUTINE W REFLEX MICROSCOPIC  I-STAT CG4 LACTIC ACID, ED  POC URINE PREG, ED  Imaging Review Dg Chest 2 View  01/01/2015   CLINICAL DATA:  Cough for 2 weeks. Asthma. Shortness of breath. Generalized body aches.  EXAM: CHEST  2 VIEW  COMPARISON:  12/31/2014  FINDINGS: Increasing linear infiltration in the right lung base may represent early pneumonia. Heart size and pulmonary vascularity are normal. No blunting of costophrenic angles. No pneumothorax. Surgical clips in the right upper quadrant.  IMPRESSION: Increasing linear infiltration in the right lung base since prior study may represent developing pneumonia.   Electronically Signed   By: Burman Nieves M.D.   On: 01/01/2015 06:55   Dg Chest 2 View  12/31/2014   CLINICAL DATA:  Initial evaluation for generalized body aches, shortness of breath.  EXAM: CHEST  2 VIEW  COMPARISON:  Prior radiograph from 04/28/2013.  FINDINGS: The cardiac and mediastinal silhouettes are stable in size and contour, and remain within normal limits.  The lungs are normally inflated. No airspace consolidation, pleural effusion, or pulmonary edema is identified. There is no pneumothorax.  No acute osseous abnormality identified. Cholecystectomy clips noted.  IMPRESSION: No active cardiopulmonary disease.   Electronically  Signed   By: Rise Mu M.D.   On: 12/31/2014 22:49     EKG Interpretation   Date/Time:  Thursday January 01 2015 03:46:56 EST Ventricular Rate:  129 PR Interval:  133 QRS Duration: 77 QT Interval:  303 QTC Calculation: 444 R Axis:   103 Text Interpretation:  Age not entered, assumed to be  44 years old for  purpose of ECG interpretation Sinus tachycardia Atrial premature complex  Right axis deviation s1q3t3 appreciated Confirmed by Rhunette Croft, MD, Janey Genta  [phone]) on 01/01/2015 3:51:33 AM      MDM   Final diagnoses:  Dyspnea  CAP (community acquired pneumonia)  Sepsis due to pneumonia    I personally performed the services described in this documentation, which was scribed in my presence. The recorded information has been reviewed and is accurate.  Pt comes in with cc of dib. Pt has some flu like symptoms, she is diabetic as well. However, her lung exam is completely clear right now. She had a low grade fever at arrival and is tachycardic. Exam is not focal for any specific infectious etiology.  7:24 AM i HAVE accidentally ordered d-dimer for this patient. Lab is processing the study already, therefore, we cant remove it from being resulted. PT HAS FLU LIKE SX. Dimer was meant to be for a different patient. I WILL NOT GET CT - PE regardless of the dimer value.  Pt is getting hydrated right now. Sx present for few days now, and she is not a tamiflu candidate.   Derwood Kaplan, MD 01/01/15 0440  7:24 AM Pt received adequate hydration, and was reassessed. She is still tachycardic, tachypneic and feels like she will get worse if sent out.  Lung exam still non focal for me. Repeat CXR ordered, as we have seen pt's who are dry have a neg CXR, with delayed imaging post hydration shows infiltrate, and her repeat CXR shows an evolving infiltrate, and thus dx of CAP has been made (cough, fevers, infiltrate). Will admit.  Dimer came at 6:20 - and is elevated. Again -  Dimer was a mistakenly ordered. This patient has infectious type symptoms - and so i have explained to the admitting team that we will not act on the midly elevated dimer. If pt is not improving, or she is getting worse, or if the independent assessment by admitting team is  concerning for PE, pt can get further workup per them.  Pt has no hx of PE, DVT and denies any exogenous estrogen use, long distance travels or surgery in the past 6 weeks, active cancer, recent immobilization.   Derwood Kaplan, MD 01/01/15 [phone]

## 2015-01-01 NOTE — Progress Notes (Signed)
At [facility] today pt temperature 102.6. Blood cultures drawn this AM. Pt temperature on my assessment 98.2 oral. Will continue to monitor. Huel Coventryosenberger, Marianela Mandrell A, RN

## 2015-01-01 NOTE — ED Notes (Signed)
Pt very irritable and uncooperative upon treatment.

## 2015-01-01 NOTE — ED Notes (Signed)
Attempted to call report

## 2015-01-01 NOTE — ED Notes (Signed)
MD at bedside. 

## 2015-01-02 DIAGNOSIS — E119 Type 2 diabetes mellitus without complications: Secondary | ICD-10-CM

## 2015-01-02 LAB — GLUCOSE, CAPILLARY
Glucose-Capillary: 92 mg/dL (ref 70–99)
Glucose-Capillary: 148 mg/dL — ABNORMAL HIGH (ref 70–99)

## 2015-01-02 LAB — CBC WITH DIFFERENTIAL/PLATELET
Basophils Absolute: 0 10*3/uL (ref 0.0–0.1)
Basophils Relative: 0 % (ref 0–1)
EOS ABS: 0.2 10*3/uL (ref 0.0–0.7)
Eosinophils Relative: 5 % (ref 0–5)
HCT: 37.4 % (ref 36.0–46.0)
HEMOGLOBIN: 13.5 g/dL (ref 12.0–15.0)
LYMPHS ABS: 1.5 10*3/uL (ref 0.7–4.0)
LYMPHS PCT: 33 % (ref 12–46)
MCH: 32.4 pg (ref 26.0–34.0)
MCHC: 36.1 g/dL — ABNORMAL HIGH (ref 30.0–36.0)
MCV: 89.7 fL (ref 78.0–100.0)
MONO ABS: 0.6 10*3/uL (ref 0.1–1.0)
Monocytes Relative: 12 % (ref 3–12)
Neutro Abs: 2.2 10*3/uL (ref 1.7–7.7)
Neutrophils Relative %: 50 % (ref 43–77)
Platelets: 132 10*3/uL — ABNORMAL LOW (ref 150–400)
RBC: 4.17 MIL/uL (ref 3.87–5.11)
RDW: 12.5 % (ref 11.5–15.5)
WBC: 4.5 10*3/uL (ref 4.0–10.5)

## 2015-01-02 LAB — LEGIONELLA ANTIGEN, URINE

## 2015-01-02 LAB — BASIC METABOLIC PANEL
Anion gap: 7 (ref 5–15)
BUN: 7 mg/dL (ref 6–23)
CALCIUM: 8.1 mg/dL — AB (ref 8.4–10.5)
CO2: 21 mmol/L (ref 19–32)
Chloride: 113 mmol/L — ABNORMAL HIGH (ref 96–112)
Creatinine, Ser: 0.58 mg/dL (ref 0.50–1.10)
GFR calc Af Amer: >90 mL/min (ref >90)
GFR calc non Af Amer: >90 mL/min (ref >90)
Glucose, Bld: 110 mg/dL — ABNORMAL HIGH (ref 70–99)
Potassium: 3.7 mmol/L (ref 3.5–5.1)
Sodium: 141 mmol/L (ref 135–145)

## 2015-01-02 LAB — HEMOGLOBIN A1C
Hgb A1c MFr Bld: 9.3 % — ABNORMAL HIGH (ref 4.8–5.6)
Mean Plasma Glucose: 220 mg/dL

## 2015-01-02 LAB — HIV ANTIBODY (ROUTINE TESTING W REFLEX): HIV Screen 4th Generation wRfx: NONREACTIVE

## 2015-01-02 MED ORDER — ALBUTEROL SULFATE HFA 108 (90 BASE) MCG/ACT IN AERS: 1.0000 | INHALATION_SPRAY | Freq: Four times a day (QID) | RESPIRATORY_TRACT | Status: DC | PRN

## 2015-01-02 MED ORDER — GUAIFENESIN-CODEINE 100-10 MG/5ML PO SOLN: 5.0000 mL | ORAL | Status: DC | PRN

## 2015-01-02 MED ORDER — LIVING WELL WITH DIABETES BOOK
Freq: Once | Status: DC
  Filled 2015-01-02: qty 1

## 2015-01-02 MED ORDER — AMOXICILLIN-POT CLAVULANATE 875-125 MG PO TABS: 1.0000 | ORAL_TABLET | Freq: Two times a day (BID) | ORAL | Status: DC

## 2015-01-02 MED ORDER — AZITHROMYCIN 500 MG PO TABS
500.0000 mg | ORAL_TABLET | Freq: Every day | ORAL | Status: DC
  Administered 2015-01-02: 500 mg via ORAL
  Filled 2015-01-02: qty 1

## 2015-01-02 MED ORDER — AZITHROMYCIN 500 MG PO TABS: 500.0000 mg | ORAL_TABLET | Freq: Every day | ORAL | Status: DC

## 2015-01-02 MED ORDER — ALBUTEROL SULFATE (2.5 MG/3ML) 0.083% IN NEBU
2.5000 mg | INHALATION_SOLUTION | Freq: Four times a day (QID) | RESPIRATORY_TRACT | Status: DC | PRN
Start: 2015-01-02 — End: 2015-01-02

## 2015-01-02 MED ORDER — METFORMIN HCL 500 MG PO TABS: 500.0000 mg | ORAL_TABLET | Freq: Two times a day (BID) | ORAL | Status: DC

## 2015-01-02 MED ORDER — ALBUTEROL SULFATE (2.5 MG/3ML) 0.083% IN NEBU: 2.5000 mg | INHALATION_SOLUTION | Freq: Four times a day (QID) | RESPIRATORY_TRACT | Status: DC | PRN

## 2015-01-02 NOTE — Progress Notes (Signed)
Inpatient Diabetes Program Recommendations  AACE/ADA: New Consensus Statement on Inpatient Glycemic Control (2013)  Target Ranges:  Prepandial:   less than 140 mg/dL      Peak postprandial:   less than 180 mg/dL (1-2 hours)      Critically ill patients:  140 - 180 mg/dL   Note:  Patient has discharge order.  [facility].  A1C 9.3.  Hasn't had PCP for more than a year.  [facility] and keep follow-up appointments.  Discussed implications of elevated A1C.  Ordered a Living with Diabetes booklet for patient to take home.  Patient appreciative.  Thank you.  Maggie Dworkin S. Elsie Lincolnouth, RN, CNS, CDE Inpatient Diabetes Program, team pager [phone]

## 2015-01-02 NOTE — Discharge Summary (Signed)
Name: Bailey Stevenson MRN: [id number] DOB: 1971-06-19 44 y.o. PCP: No primary care provider on file.  Date of Admission: 01/01/2015 12:53 AM Date of Discharge: 01/02/2015 Attending Physician: Earl Lagos, MD  Discharge Diagnosis: 1. CAP 2. DM type II  Discharge Medications:   Medication List    STOP taking these medications        guaiFENesin 600 MG 12 hr tablet  Commonly known as:  MUCINEX     PHENYLEPHRINE HCL PO     predniSONE 20 MG tablet  Commonly known as:  DELTASONE      TAKE these medications        albuterol 108 (90 BASE) MCG/ACT inhaler  Commonly known as:  PROVENTIL HFA;VENTOLIN HFA  Inhale 2 puffs into the lungs every 6 (six) hours as needed for wheezing or shortness of breath.     albuterol 108 (90 BASE) MCG/ACT inhaler  Commonly known as:  PROVENTIL HFA;VENTOLIN HFA  Inhale 1-2 puffs into the lungs every 6 (six) hours as needed for wheezing.     amoxicillin-clavulanate 875-125 MG per tablet  Commonly known as:  AUGMENTIN  Take 1 tablet by mouth every 12 (twelve) hours.  Start taking on:  01/03/2015     azithromycin 500 MG tablet  Commonly known as:  ZITHROMAX  Take 1 tablet (500 mg total) by mouth daily.     guaiFENesin-codeine 100-10 MG/5ML syrup  Take 5 mLs by mouth every 4 (four) hours as needed for cough.     levonorgestrel 20 MCG/24HR IUD  Commonly known as:  MIRENA  1 each by Intrauterine route once. March 2013        Disposition and follow-up:   Bailey Stevenson was discharged from [facility] in Good condition.  At the hospital follow up visit please address:  1.  Pneumonia; fever or chills at home? Compliance w/ antibiotics? Cough?   DM type II; can likely increase Metformin to 1000 mg bid if tolerating. May need Lantus insulin if poor response w/ HbA1c on repeat in 3 months.   2.  Labs / imaging needed at time of follow-up: None  3.  Pending labs/ test needing follow-up: None  Follow-up  Appointments:     Follow-up Information    Follow up with [facility]    .   Why:  PLEASE CALL ON MONDAY TO SET UP AN APPOINTMENT   Contact information:   [address] [place] [address] [phone]       Consultations:  None  Procedures Performed:  Dg Chest 2 View  01/01/2015   CLINICAL DATA:  Cough for 2 weeks. Asthma. Shortness of breath. Generalized body aches.  EXAM: CHEST  2 VIEW  COMPARISON:  12/31/2014  FINDINGS: Increasing linear infiltration in the right lung base may represent early pneumonia. Heart size and pulmonary vascularity are normal. No blunting of costophrenic angles. No pneumothorax. Surgical clips in the right upper quadrant.  IMPRESSION: Increasing linear infiltration in the right lung base since prior study may represent developing pneumonia.   Electronically Signed   By: Burman Nieves M.D.   On: 01/01/2015 06:55   Dg Chest 2 View  12/31/2014   CLINICAL DATA:  Initial evaluation for generalized body aches, shortness of breath.  EXAM: CHEST  2 VIEW  COMPARISON:  Prior radiograph from 04/28/2013.  FINDINGS: The cardiac and mediastinal silhouettes are stable in size and contour, and remain within normal limits.  The lungs are  normally inflated. No airspace consolidation, pleural effusion, or pulmonary edema is identified. There is no pneumothorax.  No acute osseous abnormality identified. Cholecystectomy clips noted.  IMPRESSION: No active cardiopulmonary disease.   Electronically Signed   By: Rise Mu M.D.   On: 12/31/2014 22:49   Ct Angio Chest Pe W/cm &/or Wo Cm  01/01/2015   CLINICAL DATA:  44YOF with elevated D-dimer. SOB and malaise past 3 days.Patient states she has had coughing, SOB and chest pain as well.  EXAM: CT ANGIOGRAPHY CHEST WITH CONTRAST  TECHNIQUE: Multidetector CT imaging of the chest was performed using the standard protocol during bolus administration of intravenous contrast.  Multiplanar CT image reconstructions and MIPs were obtained to evaluate the vascular anatomy.  CONTRAST:  80mL OMNIPAQUE IOHEXOL 350 MG/ML SOLN  COMPARISON:  None.  FINDINGS: The SVC is patent. RVs/LV ratio less than 1, normal. Satisfactory opacification of pulmonary arteries noted, and there is no evidence of pulmonary emboli. Patent superior and inferior pulmonary veins bilaterally. Adequate contrast opacification of the thoracic aorta with no evidence of dissection, aneurysm, or stenosis. There is bovine variant brachiocephalic arch anatomy without proximal stenosis.  No pleural or pericardial effusion. No hilar or mediastinal adenopathy. Peripheral ground-glass opacities in the anterior segment right upper lobe. Patchy poorly marginated somewhat nodular airspace disease in both lower lobes with a predominantly infrahilar distribution, right worse than left. There also some scattered poorly marginated nodular airspace opacities in the central right middle lobe.  Spurring in the mid thoracic spine. Sternum intact. Surgical clips in the gallbladder fossa. Remainder visualized upper abdomen unremarkable.  Review of the MIP images confirms the above findings.  IMPRESSION: 1. Negative for acute PE or thoracic aortic dissection. 2. Patchy airspace disease in right middle and bilateral lower lobes and alveolar opacities in the right upper lobe but, probably pneumonia in the appropriate clinical setting. Consider follow-up to confirm appropriate resolution.   Electronically Signed   By: Oley Balm M.D.   On: 01/01/2015 14:02    Admission HPI: Bailey Stevenson is a 44 yo woman pmh DM untreated for the past year who presented with increasing SOB, myalgias, and fever for the past 3 days. The patient states she thinks all of this started back over Christmas break where she had developed a productive cough and myalgias but that only lasted about 2 days with complete resolution. Then this week she started having a sore  throat, myalgias, productive cough and fever similar to the event in December. She tried some OTC sudaphed and mucinex without any relief. She works in Personnel officer at [employer] and has not received any flu vaccine and has been exposed to sick clients and a son who had URI symptoms as well. She has not had a PCP for over a year and is unaware of her CBGs at home and has not been taking any medication for her DM. She denied any CP, HA, abdominal pain, nausea/vomiting/diarrhea or anorexia, and no skin rashes. She is a 0.5ppd smoker and doesn't drink Etoh or use other drugs. She is married and has worked at [employer] for over 26 years.    Hospital Course by problem list:   1. CAP- Presented with tachycardia, tachypnea and fever meeting SIRS criteria. CXR showed questionable RLL infiltrate. Given tachycardia and tachypnea, D-dimer was ordered, shown to be elevated. CTA confirmed NO PE, however, did confirm RLL pnuemonia. Blood cultures negative. Started on Rocephin + Azithromycin in the ED w/ improvement in symptoms. Discharged  on 01/02/15 w/ Azithromycin 500 mg po qd for 2 more days + Augmentin 875-125 mg bid for total of 7 day course.   2. DM type II- HbA1c 9.3 this admission. Has been admittedly non-compliant w/ Metformin at home. Does not even know what dose she was taking. Did well on ISS-S during admission, discharged on Metformin 500 mg bid [facility]    Discharge Vitals:   BP 107/62 mmHg  Pulse 87  Temp(Src) 98.6 F (37 C) (Oral)  Resp 20  Ht 5\' 2"  (1.575 m)  Wt 140 lb 14 oz (63.9 kg)  BMI 25.76 kg/m2  SpO2 96%  Discharge Labs:  Results for orders placed or performed during the hospital encounter of 01/01/15 (from the past 24 hour(s))  Glucose, capillary     Status: Abnormal   Collection Time: 01/01/15  4:32 PM  Result Value Ref Range   Glucose-Capillary 206 (H) 70 - 99 mg/dL   Comment 1 Notify RN   Glucose, capillary     Status: Abnormal    Collection Time: 01/01/15 10:13 PM  Result Value Ref Range   Glucose-Capillary 124 (H) 70 - 99 mg/dL   Comment 1 Documented in Chart    Comment 2 Notify RN   CBC WITH DIFFERENTIAL     Status: Abnormal   Collection Time: 01/02/15  4:20 AM  Result Value Ref Range   WBC 4.5 4.0 - 10.5 K/uL   RBC 4.17 3.87 - 5.11 MIL/uL   Hemoglobin 13.5 12.0 - 15.0 g/dL   HCT 16.137.4 09.636.0 - 04.546.0 %   MCV 89.7 78.0 - 100.0 fL   MCH 32.4 26.0 - 34.0 pg   MCHC 36.1 (H) 30.0 - 36.0 g/dL   RDW 40.912.5 81.111.5 - 91.415.5 %   Platelets 132 (L) 150 - 400 K/uL   Neutrophils Relative % 50 43 - 77 %   Neutro Abs 2.2 1.7 - 7.7 K/uL   Lymphocytes Relative 33 12 - 46 %   Lymphs Abs 1.5 0.7 - 4.0 K/uL   Monocytes Relative 12 3 - 12 %   Monocytes Absolute 0.6 0.1 - 1.0 K/uL   Eosinophils Relative 5 0 - 5 %   Eosinophils Absolute 0.2 0.0 - 0.7 K/uL   Basophils Relative 0 0 - 1 %   Basophils Absolute 0.0 0.0 - 0.1 K/uL  Basic metabolic panel     Status: Abnormal   Collection Time: 01/02/15  4:20 AM  Result Value Ref Range   Sodium 141 135 - 145 mmol/L   Potassium 3.7 3.5 - 5.1 mmol/L   Chloride 113 (H) 96 - 112 mmol/L   CO2 21 19 - 32 mmol/L   Glucose, Bld 110 (H) 70 - 99 mg/dL   BUN 7 6 - 23 mg/dL   Creatinine, Ser 7.820.58 0.50 - 1.10 mg/dL   Calcium 8.1 (L) 8.4 - 10.5 mg/dL   GFR calc non Af Amer >90 >90 mL/min   GFR calc Af Amer >90 >90 mL/min   Anion gap 7 5 - 15  Glucose, capillary     Status: None   Collection Time: 01/02/15  5:58 AM  Result Value Ref Range   Glucose-Capillary 92 70 - 99 mg/dL  Glucose, capillary     Status: Abnormal   Collection Time: 01/02/15 11:40 AM  Result Value Ref Range   Glucose-Capillary 148 (H) 70 - 99 mg/dL   Comment 1 Notify RN     Signed: Dewayne ShorterEden W  Yetta Barre, MD 01/02/2015, 1:17 PM    Services Ordered on Discharge: none Equipment Ordered on Discharge: none

## 2015-01-02 NOTE — Care Management Note (Unsigned)
    Page 1 of 1   01/02/2015     3:59:02 PM CARE MANAGEMENT NOTE 01/02/2015  Patient:  Bailey Stevenson,Bailey Stevenson   Account Number:  [id number]  Date Initiated:  01/02/2015  Documentation initiated by:  Nicklos Gaxiola  Subjective/Objective Assessment:   Pt adm on 12/31/14 with CAP.  PTA, pt independent, lives with spouse.     Action/Plan:   Will follow for dc needs as pt progresses.   Anticipated DC Date:  01/04/2015   Anticipated DC Plan:  HOME/SELF CARE      DC Planning Services  CM consult      Choice offered to / List presented to:             Status of service:  In process, will continue to follow Medicare Important Message given?   (If response is "NO", the following Medicare IM given date fields will be blank) Date Medicare IM given:   Medicare IM given by:   Date Additional Medicare IM given:   Additional Medicare IM given by:    Discharge Disposition:    Per UR Regulation:  Reviewed for med. necessity/level of care/duration of stay  If discussed at Long Length of Stay Meetings, dates discussed:    Comments:

## 2015-01-02 NOTE — Progress Notes (Signed)
Subjective: Patient doing well today, breathing comfortably on room air. Still having a cough. Fever overnight, however, has been afebrile throughout the day. No nausea or vomiting.   Objective: Vital signs in last 24 hours: Filed Vitals:   01/01/15 2040 01/01/15 2201 01/02/15 0144 01/02/15 0546  BP:  124/65 105/63 107/62  Pulse:  94 86 87  Temp: 98.2 F (36.8 C) 98.2 F (36.8 C) 98.1 F (36.7 C) 98.6 F (37 C)  TempSrc: Oral Oral Oral Oral  Resp:  Height:      Weight:    140 lb 14 oz (63.9 kg)  SpO2:  100% 96% 96%   Weight change: 15 lb 14.5 oz (7.215 kg)  Intake/Output Summary (Last 24 hours) at 01/02/15 0800 Last data filed at 01/02/15 0555  Gross per 24 hour  Intake 2640.16 ml  Output   4050 ml  Net -1409.84 ml   Physical Exam: General: AA female, alert, cooperative, NAD. HEENT: PERRL, EOMI. Moist mucus membranes. Neck: Full range of motion without pain, supple, no lymphadenopathy or carotid bruits. Lungs: Clear to ascultation bilaterally, normal work of respiration, no wheezes, rales, rhonchi. Heart: RRR, no murmurs, gallops, or rubs Abdomen: Soft, non-tender, non-distended, BS + Extremities: No cyanosis, clubbing, or edema Neurologic: Alert & oriented x3, cranial nerves II-XII intact, strength grossly intact, sensation intact to light touch   Lab Results: Basic Metabolic Panel:  Recent Labs Lab 01/01/15 0350 01/02/15 0420  NA 132* 141  K 3.7 3.7  CL 102 113*  CO2 22 21  GLUCOSE 248* 110*  BUN 7 7  CREATININE 0.76 0.58  CALCIUM 8.9 8.1*   CBC:  Recent Labs Lab 01/01/15 0350 01/02/15 0420  WBC 6.7 4.5  NEUTROABS 5.3 2.2  HGB 15.0 13.5  HCT 41.1 37.4  MCV 88.0 89.7  PLT 145* 132*   Cardiac Enzymes:  Recent Labs Lab 01/01/15 0350  TROPONINI <0.03   D-Dimer:  Recent Labs Lab 01/01/15 0352  DDIMER 0.63*   CBG:  Recent Labs Lab 01/01/15 1115 01/01/15 1632 01/01/15 2213 01/02/15 0558  GLUCAP 154* 206* 124* 92    Hemoglobin A1C:  Recent Labs Lab 01/01/15 0900  HGBA1C 9.3*   Urinalysis:  Recent Labs Lab 01/01/15 0610  COLORURINE YELLOW  LABSPEC 1.027  PHURINE 5.5  GLUCOSEU >1000*  HGBUR NEGATIVE  BILIRUBINUR NEGATIVE  KETONESUR 15*  PROTEINUR NEGATIVE  UROBILINOGEN 1.0  NITRITE NEGATIVE  LEUKOCYTESUR NEGATIVE   Studies/Results: Dg Chest 2 View  01/01/2015   CLINICAL DATA:  Cough for 2 weeks. Asthma. Shortness of breath. Generalized body aches.  EXAM: CHEST  2 VIEW  COMPARISON:  12/31/2014  FINDINGS: Increasing linear infiltration in the right lung base may represent early pneumonia. Heart size and pulmonary vascularity are normal. No blunting of costophrenic angles. No pneumothorax. Surgical clips in the right upper quadrant.  IMPRESSION: Increasing linear infiltration in the right lung base since prior study may represent developing pneumonia.   Electronically Signed   By: Burman Nieves M.D.   On: 01/01/2015 06:55   Dg Chest 2 View  12/31/2014   CLINICAL DATA:  Initial evaluation for generalized body aches, shortness of breath.  EXAM: CHEST  2 VIEW  COMPARISON:  Prior radiograph from 04/28/2013.  FINDINGS: The cardiac and mediastinal silhouettes are stable in size and contour, and remain within normal limits.  The lungs are normally inflated. No airspace consolidation, pleural effusion, or pulmonary edema is identified. There is no pneumothorax.  No acute osseous  abnormality identified. Cholecystectomy clips noted.  IMPRESSION: No active cardiopulmonary disease.   Electronically Signed   By: Rise Mu M.D.   On: 12/31/2014 22:49   Ct Angio Chest Pe W/cm &/or Wo Cm  01/01/2015   CLINICAL DATA:  43YOF with elevated D-dimer. SOB and malaise past 3 days.Patient states she has had coughing, SOB and chest pain as well.  EXAM: CT ANGIOGRAPHY CHEST WITH CONTRAST  TECHNIQUE: Multidetector CT imaging of the chest was performed using the standard protocol during bolus administration of  intravenous contrast. Multiplanar CT image reconstructions and MIPs were obtained to evaluate the vascular anatomy.  CONTRAST:  80mL OMNIPAQUE IOHEXOL 350 MG/ML SOLN  COMPARISON:  None.  FINDINGS: The SVC is patent. RVs/LV ratio less than 1, normal. Satisfactory opacification of pulmonary arteries noted, and there is no evidence of pulmonary emboli. Patent superior and inferior pulmonary veins bilaterally. Adequate contrast opacification of the thoracic aorta with no evidence of dissection, aneurysm, or stenosis. There is bovine variant brachiocephalic arch anatomy without proximal stenosis.  No pleural or pericardial effusion. No hilar or mediastinal adenopathy. Peripheral ground-glass opacities in the anterior segment right upper lobe. Patchy poorly marginated somewhat nodular airspace disease in both lower lobes with a predominantly infrahilar distribution, right worse than left. There also some scattered poorly marginated nodular airspace opacities in the central right middle lobe.  Spurring in the mid thoracic spine. Sternum intact. Surgical clips in the gallbladder fossa. Remainder visualized upper abdomen unremarkable.  Review of the MIP images confirms the above findings.  IMPRESSION: 1. Negative for acute PE or thoracic aortic dissection. 2. Patchy airspace disease in right middle and bilateral lower lobes and alveolar opacities in the right upper lobe but, probably pneumonia in the appropriate clinical setting. Consider follow-up to confirm appropriate resolution.   Electronically Signed   By: Oley Balm M.D.   On: 01/01/2015 14:02   Medications: I have reviewed the patient's current medications. Scheduled Meds: . azithromycin  500 mg Intravenous Q24H  . cefTRIAXone (ROCEPHIN)  IV  1 g Intravenous Q24H  . heparin  5,000 Units Subcutaneous 3 times per day  . insulin aspart  0-9 Units Subcutaneous TID WC   Continuous Infusions: . sodium chloride 125 mL/hr at 01/02/15 0252   PRN  Meds:.guaiFENesin-codeine, ibuprofen   Assessment/Plan: 44 y/o F w/ PMHx of uncontrolled DM type II, admitted for CAP.  CAP: Presented with tachycardia, tachypnea and fever meeting SIRS criteria. CXR showed questionable RLL infiltrate. Given tachycardia and tachypnea, D-dimer was ordered, shown to be elevated. CTA confirmed NO PE, however, did confirm RLL pnuemonia. Blood cultures negative to date. Appears improved on exam today, Has been receiving Rocephin + Azithromycin.  -Change to Azithromycin 500 mg po for 2 more days (total dose of 1500 mg) + Augmentin 875-125 mg bid -Continue Guaifenesin-Codeine prn for cough -D/c IVF -Give Albuterol HFA on discharge.   Uncontrolled DM Type II: Repeat HbA1c 9.3. Has not been compliant w/ home Metformin for some time.  -Continue ISS-S for now -Restart Metformin 500 mg bid on discharge -Follow up w/ [facility] for further management.   Dispo: Anticipated discharge today.  The patient does not have a current PCP (No primary care provider on file.) and does not need an [facility] hospital follow-up appointment after discharge.  The patient does not have transportation limitations that hinder transportation to clinic appointments.  .Services Needed at time of discharge: Y = Yes, Blank = No PT:   OT:   RN:  Equipment:   Other:     LOS: 1 day   Courtney ParisEden W Kaizlee Carlino, MD 01/02/2015, 8:00 AM

## 2015-01-02 NOTE — Discharge Instructions (Signed)
1. You have a follow up appointment as follows:  [facility]  PLEASE CALL ON MONDAY TO SET UP AN APPOINTMENT  [address] [place] [address] [phone]  2. Please take all medications as prescribed.   Take Azithromycin 500 mg daily for 1 more day (starting tomorrow).   Take Augmentin 875-125 mg twice daily for 5 more days starting tomorrow.   Please take Metformin 500 mg twice daily for your diabetes.   3. If you have worsening of your symptoms or new symptoms arise, please call the clinic ([phone]), or go to the ER immediately if symptoms are severe.    Pneumonia Pneumonia is an infection of the lungs.  CAUSES Pneumonia may be caused by bacteria or a virus. Usually, these infections are caused by breathing infectious particles into the lungs (respiratory tract). SIGNS AND SYMPTOMS   Cough.  Fever.  Chest pain.  Increased rate of breathing.  Wheezing.  Mucus production. DIAGNOSIS  If you have the common symptoms of pneumonia, your health care provider will typically confirm the diagnosis with a chest X-ray. The X-ray will show an abnormality in the lung (pulmonary infiltrate) if you have pneumonia. Other tests of your blood, urine, or sputum may be done to find the specific cause of your pneumonia. Your health care provider may also do tests (blood gases or pulse oximetry) to see how well your lungs are working. TREATMENT  Some forms of pneumonia may be spread to other people when you cough or sneeze. You may be asked to wear a mask before and during your exam. Pneumonia that is caused by bacteria is treated with antibiotic medicine. Pneumonia that is caused by the influenza virus may be treated with an antiviral medicine. Most other viral infections must run their course. These infections will not respond to antibiotics.  HOME CARE INSTRUCTIONS   Cough suppressants may be used if you are losing too much rest.  However, coughing protects you by clearing your lungs. You should avoid using cough suppressants if you can.  Your health care provider may have prescribed medicine if he or she thinks your pneumonia is caused by bacteria or influenza. Finish your medicine even if you start to feel better.  Your health care provider may also prescribe an expectorant. This loosens the mucus to be coughed up.  Take medicines only as directed by your health care provider.  Do not smoke. Smoking is a common cause of bronchitis and can contribute to pneumonia. If you are a smoker and continue to smoke, your cough may last several weeks after your pneumonia has cleared.  A cold steam vaporizer or humidifier in your room or home may help loosen mucus.  Coughing is often worse at night. Sleeping in a semi-upright position in a recliner or using a couple pillows under your head will help with this.  Get rest as you feel it is needed. Your body will usually let you know when you need to rest. PREVENTION A pneumococcal shot (vaccine) is available to prevent a common bacterial cause of pneumonia. This is usually suggested for:  People over 49 years old.  Patients on chemotherapy.  People with chronic lung problems, such as bronchitis or emphysema.  People with immune system problems. If you are over 65 or have a high risk condition, you may receive the pneumococcal vaccine if you have not received it before. In some countries, a routine influenza vaccine is also recommended. This vaccine can help  prevent some cases of pneumonia.You may be offered the influenza vaccine as part of your care. If you smoke, it is time to quit. You may receive instructions on how to stop smoking. Your health care provider can provide medicines and counseling to help you quit. SEEK MEDICAL CARE IF: You have a fever. SEEK IMMEDIATE MEDICAL CARE IF:   Your illness becomes worse. This is especially true if you are elderly or weakened from  any other disease.  You cannot control your cough with suppressants and are losing sleep.  You begin coughing up blood.  You develop pain which is getting worse or is uncontrolled with medicines.  Any of the symptoms which initially brought you in for treatment are getting worse rather than better.  You develop shortness of breath or chest pain. MAKE SURE YOU:   Understand these instructions.  Will watch your condition.  Will get help right away if you are not doing well or get worse. Document Released: 11/21/2005 Document Revised: 04/07/2014 Document Reviewed: 02/10/2011 [facility] Patient Information 2015 [place]. This information is not intended to replace advice given to you by your health care provider. Make sure you discuss any questions you have with your health care provider.

## 2015-01-02 NOTE — Progress Notes (Signed)
Subjective: Per nursing, patient spiked a fever of 102.6 at 3:50 pm on 1/28 and new blood cultures were drawn. Her RR was 22 and pulse 117 with O2 sat of 95% on RA. She received ibuprofen, which improved her body aches. Patient has noticed wheezing and shortness of breath.  Objective: Vital signs in last 24 hours: Filed Vitals:   01/01/15 2040 01/01/15 2201 01/02/15 0144 01/02/15 0546  BP:  124/65 105/63 107/62  Pulse:  94 86 87  Temp: 98.2 F (36.8 C) 98.2 F (36.8 C) 98.1 F (36.7 C) 98.6 F (37 C)  TempSrc: Oral Oral Oral Oral  Resp:  Height:      Weight:    63.9 kg (140 lb 14 oz)  SpO2:  100% 96% 96%   Weight change: 7.215 kg (15 lb 14.5 oz)  Intake/Output Summary (Last 24 hours) at 01/02/15 1610 Last data filed at 01/02/15 0555  Gross per 24 hour  Intake 2640.16 ml  Output   4050 ml  Net -1409.84 ml   Constitutional: Patient lying in bed in no acute distress and cooperative with exam Head: Normocephalic and atraumatic Nose: No erythema or drainage noted Mouth: No erythema or exudates, MMM Eyes: PERRL, EOMI, conjunctivae normal Cardiovascular: RRR, S1 normal, S2 normal, no murmurs, rubs, or gallops Pulmonary/Chest: normal respiratory effort on room air, some rhonchi over right lower lobe Abdominal: Soft, non-tender, non-distended, bowel sounds present, no masses, organomegaly, or guarding present Neurological: Alert, oriented to person, place and time Skin: Warm, dry, and intact. No rash, cyanosis, or clubbing  Psychiatric: Normal mood and affect  Lab Results: Basic Metabolic Panel:  Recent Labs Lab 01/01/15 0350 01/02/15 0420  NA 132* 141  K 3.7 3.7  CL 102 113*  CO2 22 21  GLUCOSE 248* 110*  BUN 7 7  CREATININE 0.76 0.58  CALCIUM 8.9 8.1*   Liver Function Tests: No results for input(s): AST, ALT, ALKPHOS, BILITOT, PROT, ALBUMIN in the last 168 hours. No results for input(s): LIPASE, AMYLASE in the last 168 hours. No results for input(s):  AMMONIA in the last 168 hours. CBC:  Recent Labs Lab 01/01/15 0350 01/02/15 0420  WBC 6.7 4.5  NEUTROABS 5.3 2.2  HGB 15.0 13.5  HCT 41.1 37.4  MCV 88.0 89.7  PLT 145* 132*   Cardiac Enzymes:  Recent Labs Lab 01/01/15 0350  TROPONINI <0.03   BNP: No results for input(s): PROBNP in the last 168 hours. D-Dimer:  Recent Labs Lab 01/01/15 0352  DDIMER 0.63*   CBG:  Recent Labs Lab 01/01/15 1115 01/01/15 1632 01/01/15 2213 01/02/15 0558  GLUCAP 154* 206* 124* 92   Hemoglobin A1C:  Recent Labs Lab 01/01/15 0900  HGBA1C 9.3*   Fasting Lipid Panel: No results for input(s): CHOL, HDL, LDLCALC, TRIG, CHOLHDL, LDLDIRECT in the last 168 hours. Thyroid Function Tests: No results for input(s): TSH, T4TOTAL, FREET4, T3FREE, THYROIDAB in the last 168 hours. Coagulation: No results for input(s): LABPROT, INR in the last 168 hours. Anemia Panel: No results for input(s): VITAMINB12, FOLATE, FERRITIN, TIBC, IRON, RETICCTPCT in the last 168 hours. Urine Drug Screen: Drugs of Abuse  No results found for: LABOPIA, COCAINSCRNUR, LABBENZ, AMPHETMU, THCU, LABBARB  Alcohol Level: No results for input(s): ETH in the last 168 hours. Urinalysis:  Recent Labs Lab 01/01/15 0610  COLORURINE YELLOW  LABSPEC 1.027  PHURINE 5.5  GLUCOSEU >1000*  HGBUR NEGATIVE  BILIRUBINUR NEGATIVE  KETONESUR 15*  PROTEINUR NEGATIVE  UROBILINOGEN 1.0  NITRITE  NEGATIVE  LEUKOCYTESUR NEGATIVE    Micro Results: Recent Results (from the past 240 hour(s))  Culture, blood (routine x 2)     Status: None (Preliminary result)   Collection Time: 01/01/15  7:45 AM  Result Value Ref Range Status   Specimen Description BLOOD LEFT HAND  Final   Special Requests BOTTLES DRAWN AEROBIC AND ANAEROBIC 5CC  Final   Culture   Final           BLOOD CULTURE RECEIVED NO GROWTH TO DATE CULTURE WILL BE HELD FOR 5 DAYS BEFORE ISSUING A FINAL NEGATIVE REPORT [facility]    Report Status  PENDING  Incomplete  Culture, blood (routine x 2)     Status: None (Preliminary result)   Collection Time: 01/01/15  7:50 AM  Result Value Ref Range Status   Specimen Description BLOOD RIGHT HAND  Final   Special Requests BOTTLES DRAWN AEROBIC AND ANAEROBIC 5CC  Final   Culture   Final           BLOOD CULTURE RECEIVED NO GROWTH TO DATE CULTURE WILL BE HELD FOR 5 DAYS BEFORE ISSUING A FINAL NEGATIVE REPORT Performed at [facility]    Report Status PENDING  Incomplete   Studies/Results: Dg Chest 2 View  01/01/2015   CLINICAL DATA:  Cough for 2 weeks. Asthma. Shortness of breath. Generalized body aches.  EXAM: CHEST  2 VIEW  COMPARISON:  12/31/2014  FINDINGS: Increasing linear infiltration in the right lung base may represent early pneumonia. Heart size and pulmonary vascularity are normal. No blunting of costophrenic angles. No pneumothorax. Surgical clips in the right upper quadrant.  IMPRESSION: Increasing linear infiltration in the right lung base since prior study may represent developing pneumonia.   Electronically Signed   By: Burman NievesWilliam  Stevens M.D.   On: 01/01/2015 06:55   Dg Chest 2 View  12/31/2014   CLINICAL DATA:  Initial evaluation for generalized body aches, shortness of breath.  EXAM: CHEST  2 VIEW  COMPARISON:  Prior radiograph from 04/28/2013.  FINDINGS: The cardiac and mediastinal silhouettes are stable in size and contour, and remain within normal limits.  The lungs are normally inflated. No airspace consolidation, pleural effusion, or pulmonary edema is identified. There is no pneumothorax.  No acute osseous abnormality identified. Cholecystectomy clips noted.  IMPRESSION: No active cardiopulmonary disease.   Electronically Signed   By: Rise MuBenjamin  McClintock M.D.   On: 12/31/2014 22:49   Ct Angio Chest Pe W/cm &/or Wo Cm  01/01/2015   CLINICAL DATA:  43YOF with elevated D-dimer. SOB and malaise past 3 days.Patient states she has had coughing, SOB and chest pain as well.   EXAM: CT ANGIOGRAPHY CHEST WITH CONTRAST  TECHNIQUE: Multidetector CT imaging of the chest was performed using the standard protocol during bolus administration of intravenous contrast. Multiplanar CT image reconstructions and MIPs were obtained to evaluate the vascular anatomy.  CONTRAST:  80mL OMNIPAQUE IOHEXOL 350 MG/ML SOLN  COMPARISON:  None.  FINDINGS: The SVC is patent. RVs/LV ratio less than 1, normal. Satisfactory opacification of pulmonary arteries noted, and there is no evidence of pulmonary emboli. Patent superior and inferior pulmonary veins bilaterally. Adequate contrast opacification of the thoracic aorta with no evidence of dissection, aneurysm, or stenosis. There is bovine variant brachiocephalic arch anatomy without proximal stenosis.  No pleural or pericardial effusion. No hilar or mediastinal adenopathy. Peripheral ground-glass opacities in the anterior segment right upper lobe. Patchy poorly marginated somewhat nodular airspace disease in both  lower lobes with a predominantly infrahilar distribution, right worse than left. There also some scattered poorly marginated nodular airspace opacities in the central right middle lobe.  Spurring in the mid thoracic spine. Sternum intact. Surgical clips in the gallbladder fossa. Remainder visualized upper abdomen unremarkable.  Review of the MIP images confirms the above findings.  IMPRESSION: 1. Negative for acute PE or thoracic aortic dissection. 2. Patchy airspace disease in right middle and bilateral lower lobes and alveolar opacities in the right upper lobe but, probably pneumonia in the appropriate clinical setting. Consider follow-up to confirm appropriate resolution.   Electronically Signed   By: Oley Balm M.D.   On: 01/01/2015 14:02   Medications:  Scheduled Meds: . azithromycin  500 mg Intravenous Q24H  . cefTRIAXone (ROCEPHIN)  IV  1 g Intravenous Q24H  . heparin  5,000 Units Subcutaneous 3 times per day  . insulin aspart  0-9 Units  Subcutaneous TID WC   Continuous Infusions: . sodium chloride 125 mL/hr at 01/02/15 0252   PRN Meds:.guaiFENesin-codeine, ibuprofen  Assessment/Plan: Principal Problem:   CAP (community acquired pneumonia) Active Problems:   Sepsis   Bailey Stevenson is a 44 y.o. female with PMH of DM (off treatment for 2 years), asthma who p/w worsening SOB and myalgias for 3 days and evidence of R lung base pneumonia on CXR and CTA   1. Sepsis 2/2 CAP: pt presents with tachycardia, tachypnea and fever meeting SIRS criteria. After IVF hydration repeat CXR was performed that showed signs of RLL PNA. Lactic acid normal. Other infectious workup includingPt flu panel was negative and UA was negative. Pt Well's score 1.5 and modified Geneva score 5 which is moderate (both for HR) and D-dimer positive. EKG also concerning with RA deviation and s1q3t3 must rule out PE.  -BCx: NGTD -Switch to antibiotics by mouth: Augmentin and Azithromycin for five day course -mucinex -advil -robitussin -NS@150cc /hr x 12 hrs -CTA chest: negative for PE  2. DM: uncontrolled and unknown given lack of medical care.  -HgbA1c: 9.3 -SSI while in the hospital. Will plan to start patient on metformin upon discharge  Dispo: Anticipated discharge today  The patient does not have a current PCP (No primary care provider on file.) and does need an [facility] hospital follow-up appointment after discharge.   Services Needed at time of discharge: Y = Yes, Blank = No PT:   OT:   RN:   Equipment:   Other:      LOS: 1 day   This is a Psychologist, occupational Note. The care of the patient was discussed with Dr. Yetta Barre and the assessment and plan was formulated with their assistance. Please see their note for official documentation of the patient encounter.  Signed: Chiquita Loth, Med Student Internal Medicine Teaching Service Team B2  [phone] 01/02/2015, 8:21 AM

## 2015-01-07 LAB — CULTURE, BLOOD (ROUTINE X 2)
CULTURE: NO GROWTH
Culture: NO GROWTH

## 2015-06-01 ENCOUNTER — Emergency Department (INDEPENDENT_AMBULATORY_CARE_PROVIDER_SITE_OTHER)
Admission: EM | Admit: 2015-06-01 | Discharge: 2015-06-01 | Disposition: A | Discharge status: Home / Self Care | Payer: Managed Care, Other (non HMO) | Source: Home / Self Care | Attending: Family Medicine | Admitting: Family Medicine

## 2015-06-01 ENCOUNTER — Encounter (HOSPITAL_COMMUNITY): Payer: Self-pay | Admitting: Emergency Medicine

## 2015-06-01 DIAGNOSIS — H1011 Acute atopic conjunctivitis, right eye: Secondary | ICD-10-CM | POA: Diagnosis not present

## 2015-06-01 MED ORDER — POLYMYXIN B-TRIMETHOPRIM 10000-0.1 UNIT/ML-% OP SOLN: 2.0000 [drp] | Freq: Four times a day (QID) | OPHTHALMIC | Status: DC

## 2015-06-01 NOTE — ED Provider Notes (Signed)
Bailey Stevenson is a 44 y.o. female who presents to Urgent Care today for right eye redness drainage and itchy sensation. She also notes runny nose and sneezing and a gritty sensation in her eye. Symptoms present for about one week. No treatment tried yet. No fevers chills nausea vomiting or diarrhea.   Past Medical History  Diagnosis Date  . Diabetes mellitus     TYPE II  . Asthma   . Pneumonia    Past Surgical History  Procedure Laterality Date  . Cholecystectomy    . Cesarean section     History  Substance Use Topics  . Smoking status: Current Every Day Smoker -- 0.25 packs/day for 9 years    Types: Cigarettes  . Smokeless tobacco: Never Used  . Alcohol Use: No   ROS as above Medications: Current Facility-Administered Medications  Medication Dose Route Frequency Provider Last Rate Last Dose  . levonorgestrel (MIRENA) 20 MCG/24HR IUD   Intrauterine Once Ok Edwards, MD       Current Outpatient Prescriptions  Medication Sig Dispense Refill  . albuterol (PROVENTIL HFA;VENTOLIN HFA) 108 (90 BASE) MCG/ACT inhaler Inhale 2 puffs into the lungs every 6 (six) hours as needed for wheezing or shortness of breath.    Marland Kitchen albuterol (PROVENTIL HFA;VENTOLIN HFA) 108 (90 BASE) MCG/ACT inhaler Inhale 1-2 puffs into the lungs every 6 (six) hours as needed for wheezing. 1 Inhaler 1  . amoxicillin-clavulanate (AUGMENTIN) 875-125 MG per tablet Take 1 tablet by mouth every 12 (twelve) hours. 10 tablet 0  . azithromycin (ZITHROMAX) 500 MG tablet Take 1 tablet (500 mg total) by mouth daily. 1 tablet 0  . guaiFENesin-codeine 100-10 MG/5ML syrup Take 5 mLs by mouth every 4 (four) hours as needed for cough. 120 mL 0  . levonorgestrel (MIRENA) 20 MCG/24HR IUD 1 each by Intrauterine route once. March 2013    . metFORMIN (GLUCOPHAGE) 500 MG tablet Take 1 tablet (500 mg total) by mouth 2 (two) times daily with a meal. 60 tablet 5  . trimethoprim-polymyxin b (POLYTRIM) ophthalmic solution Place 2  drops into the right eye every 6 (six) hours. 10 mL 0   Allergies  Allergen Reactions  . Shellfish Allergy Anaphylaxis    Throat swelling  . Eggs Or Egg-Derived Products Nausea And Vomiting     Exam:  BP 136/79 mmHg  Pulse 85  Temp(Src) 97.9 F (36.6 C) (Oral)  Resp 12  SpO2 98% Gen: Well NAD HEENT: EOMI,  MMM minimal right eye conjunctival injection with clear watery discharge. PERRLA bilaterally. Lungs: Normal work of breathing. CTABL Heart: RRR no MRG Abd: NABS, Soft. Nondistended, Nontender Exts: Brisk capillary refill, warm and well perfused.   No results found for this or any previous visit (from the past 24 hour(s)). No results found.  Assessment and Plan: 44 y.o. female with allergic conjunctivitis likely. Treat with Zaditor and Zyrtec. Polytrim if not better. Follow-up with ophthalmology as needed.  Discussed warning signs or symptoms. Please see discharge instructions. Patient expresses understanding.     Rodolph Bong, MD 06/01/15 2021

## 2015-06-01 NOTE — Discharge Instructions (Signed)
Thank you for coming in today. Use over-the-counter Zaditor eyedrops (Ketotifen) Use over-the-counter Zyrtec (cetirizine)  Use Systane artificial tears as needed Use the antibiotic eye drops if not better.    Allergic Conjunctivitis The conjunctiva is a thin membrane that covers the visible white part of the eyeball and the underside of the eyelids. This membrane protects and lubricates the eye. The membrane has small blood vessels running through it that can normally be seen. When the conjunctiva becomes inflamed, the condition is called conjunctivitis. In response to the inflammation, the conjunctival blood vessels become swollen. The swelling results in redness in the normally white part of the eye. The blood vessels of this membrane also react when a person has allergies and is then called allergic conjunctivitis. This condition usually lasts for as long as the allergy persists. Allergic conjunctivitis cannot be passed to another person (non-contagious). The likelihood of bacterial infection is great and the cause is not likely due to allergies if the inflamed eye has:  A sticky discharge.  Discharge or sticking together of the lids in the morning.  Scaling or flaking of the eyelids where the eyelashes come out.  Red swollen eyelids. CAUSES   Viruses.  Irritants such as foreign bodies.  Chemicals.  General allergic reactions.  Inflammation or serious diseases in the inside or the outside of the eye or the orbit (the boney cavity in which the eye sits) can cause a "red eye." SYMPTOMS   Eye redness.  Tearing.  Itchy eyes.  Burning feeling in the eyes.  Clear drainage from the eye.  Allergic reaction due to pollens or ragweed sensitivity. Seasonal allergic conjunctivitis is frequent in the spring when pollens are in the air and in the fall. DIAGNOSIS  This condition, in its many forms, is usually diagnosed based on the history and an ophthalmological exam. It usually  involves both eyes. If your eyes react at the same time every year, allergies may be the cause. While most "red eyes" are due to allergy or an infection, the role of an eye (ophthalmological) exam is important. The exam can rule out serious diseases of the eye or orbit. TREATMENT   Non-antibiotic eye drops, ointments, or medications by mouth may be prescribed if the ophthalmologist is sure the conjunctivitis is due to allergies alone.  Over-the-counter drops and ointments for allergic symptoms should be used only after other causes of conjunctivitis have been ruled out, or as your caregiver suggests. Medications by mouth are often prescribed if other allergy-related symptoms are present. If the ophthalmologist is sure that the conjunctivitis is due to allergies alone, treatment is normally limited to drops or ointments to reduce itching and burning. HOME CARE INSTRUCTIONS   Wash hands before and after applying drops or ointments, or touching the inflamed eye(s) or eyelids.  Do not let the eye dropper tip or ointment tube touch the eyelid when putting medicine in your eye.  Stop using your soft contact lenses and throw them away. Use a new pair of lenses when recovery is complete. You should run through sterilizing cycles at least three times before use after complete recovery if the old soft contact lenses are to be used. Hard contact lenses should be stopped. They need to be thoroughly sterilized before use after recovery.  Itching and burning eyes due to allergies is often relieved by using a cool cloth applied to closed eye(s). SEEK MEDICAL CARE IF:   Your problems do not go away after two or three days of  treatment.  Your lids are sticky (especially in the morning when you wake up) or stick together.  Discharge develops. Antibiotics may be needed either as drops, ointment, or by mouth.  You have extreme light sensitivity.  An oral temperature above 102 F (38.9 C) develops.  Pain in  or around the eye or any other visual symptom develops. MAKE SURE YOU:   Understand these instructions.  Will watch your condition.  Will get help right away if you are not doing well or get worse. Document Released: 02/11/2003 Document Revised: 02/13/2012 Document Reviewed: 01/07/2008 [facility] Patient Information 2015 [employer] [place]. This information is not intended to replace advice given to you by your health care provider. Make sure you discuss any questions you have with your health care provider.

## 2015-06-01 NOTE — ED Notes (Signed)
C/o right pink eye onset 1 week Sx include watery, redness, irritation and drainage in the am Denies inj/trauma Alert, no signs of acute distress.

## 2015-06-11 ENCOUNTER — Ambulatory Visit: Payer: Self-pay | Admitting: Medical

## 2015-06-17 ENCOUNTER — Encounter: Payer: Self-pay | Admitting: Medical

## 2015-06-17 ENCOUNTER — Ambulatory Visit (INDEPENDENT_AMBULATORY_CARE_PROVIDER_SITE_OTHER): Payer: Managed Care, Other (non HMO) | Admitting: Medical

## 2015-06-17 VITALS — BP 122/80 | HR 94 | Temp 97.9°F | Resp 15 | Wt 135.0 lb

## 2015-06-17 DIAGNOSIS — F329 Major depressive disorder, single episode, unspecified: Secondary | ICD-10-CM | POA: Diagnosis not present

## 2015-06-17 DIAGNOSIS — N898 Other specified noninflammatory disorders of vagina: Secondary | ICD-10-CM

## 2015-06-17 DIAGNOSIS — Z91199 Patient's noncompliance with other medical treatment and regimen due to unspecified reason: Secondary | ICD-10-CM

## 2015-06-17 DIAGNOSIS — Z9119 Patient's noncompliance with other medical treatment and regimen: Secondary | ICD-10-CM

## 2015-06-17 DIAGNOSIS — R4589 Other symptoms and signs involving emotional state: Secondary | ICD-10-CM

## 2015-06-17 DIAGNOSIS — Z720 Tobacco use: Secondary | ICD-10-CM

## 2015-06-17 DIAGNOSIS — J4520 Mild intermittent asthma, uncomplicated: Secondary | ICD-10-CM | POA: Diagnosis not present

## 2015-06-17 DIAGNOSIS — E1165 Type 2 diabetes mellitus with hyperglycemia: Secondary | ICD-10-CM

## 2015-06-17 DIAGNOSIS — F17200 Nicotine dependence, unspecified, uncomplicated: Secondary | ICD-10-CM

## 2015-06-17 DIAGNOSIS — IMO0002 Reserved for concepts with insufficient information to code with codable children: Secondary | ICD-10-CM

## 2015-06-17 LAB — HEMOGLOBIN A1C
Hgb A1c MFr Bld: 10 % — ABNORMAL HIGH (ref <5.7)
MEAN PLASMA GLUCOSE: 240 mg/dL — AB (ref <117)

## 2015-06-17 LAB — COMPREHENSIVE METABOLIC PANEL
ALT: 35 U/L (ref 0–35)
AST: 24 U/L (ref 0–37)
Albumin: 3.8 g/dL (ref 3.5–5.2)
Alkaline Phosphatase: 122 U/L — ABNORMAL HIGH (ref 39–117)
BUN: 9 mg/dL (ref 6–23)
CHLORIDE: 105 meq/L (ref 96–112)
CO2: 25 meq/L (ref 19–32)
Calcium: 9.2 mg/dL (ref 8.4–10.5)
Creat: 0.7 mg/dL (ref 0.50–1.10)
GLUCOSE: 170 mg/dL — AB (ref 70–99)
Potassium: 4 mEq/L (ref 3.5–5.3)
Sodium: 138 mEq/L (ref 135–145)
Total Bilirubin: 0.3 mg/dL (ref 0.2–1.2)
Total Protein: 6.5 g/dL (ref 6.0–8.3)

## 2015-06-17 LAB — CBC
HEMATOCRIT: 44.4 % (ref 36.0–46.0)
HEMOGLOBIN: 15.7 g/dL — AB (ref 12.0–15.0)
MCH: 32 pg (ref 26.0–34.0)
MCHC: 35.4 g/dL (ref 30.0–36.0)
MCV: 90.6 fL (ref 78.0–100.0)
MPV: 9.8 fL (ref 8.6–12.4)
PLATELETS: 187 10*3/uL (ref 150–400)
RBC: 4.9 MIL/uL (ref 3.87–5.11)
RDW: 13.8 % (ref 11.5–15.5)
WBC: 7.4 10*3/uL (ref 4.0–10.5)

## 2015-06-17 MED ORDER — FLUCONAZOLE 150 MG PO TABS: 150.0000 mg | ORAL_TABLET | Freq: Once | ORAL | Status: DC

## 2015-06-17 MED ORDER — FLUCONAZOLE 150 MG PO TABS: ORAL_TABLET | ORAL | Status: DC

## 2015-06-17 MED ORDER — TERBINAFINE HCL 1 % EX CREA: 1.0000 "application " | TOPICAL_CREAM | Freq: Two times a day (BID) | CUTANEOUS | Status: DC

## 2015-06-17 NOTE — Patient Instructions (Addendum)
Recommendations:  We will call with lab results  Use Lamisil cream for the arm rash x 2-3 weeks until resolved  Begin diflucan, 1 tablet weekly for 3 weeks for yeast infection  Check glucose every morning and write the numbers down in a log book  Eat a healthy diabetic diet  Exercise, preferably every day  QUIT SMOKING!  Consider counseling again for depressed mood  If needed, return on separate visit to discuss mood, medication for depression

## 2015-06-17 NOTE — Progress Notes (Signed)
    Subjective:   Bailey Stevenson is an 44 y.o. female who presents as a new patient today.   [facility]  Here today to establish care and has diabetes. Diagnosed 2000.  Doesn't recall last HgbA1C.  Has only been on metformin prior other than on insulin while pregnancy in the hospital.   Doesn't have a glucometer.  Checks feet daily.  Exercise - just at work.  Works as Archivistfood service supervisor.   Eats relatively healthy.      Having some rash, breaking out on both arms.  Rash been present about a month.  Using some Vaseline lotion for sensitive skin.    Thinks she may have a yeast infection, usually gets this if sugars are high.  Have some vaginal discharge and itching.  Is married, no concern for STD.   Has hx/o gonorrhea 5 years ago during period of separation from husband.  No hx/o BV.    Depressed mood.  Has felt this way for years, has seen counseling in the past.   No SI/HI.  No prior medication.  No other c/o  The following portions of the patient's history were reviewed and updated as appropriate: allergies, current medications, past family history, past medical history, past social history, past surgical history and problem list.  ROS as in subjective above    Objective:   BP 122/80 mmHg  Pulse 94  Temp(Src) 97.9 F (36.6 C) (Oral)  Resp 15  Wt 135 lb (61.236 kg)  General appearance: alert, no distress, WD/WN, AA female Several small round rough patches 1-3 cm on bilat upper arms suggestive of tinea Neck: supple, no lymphadenopathy, no thyromegaly, no masses Heart: RRR, normal S1, S2, no murmurs Lungs: CTA bilaterally, no wheezes, rhonchi, or rales Abdomen: +bs, soft, non tender, non distended, no masses, no hepatomegaly, no splenomegaly Pulses: 2+ symmetric, upper and lower extremities, normal cap refill Ext: no edema Gyn - declined     Assessment:   Encounter Diagnoses  Name Primary?  . Diabetes type 2, uncontrolled Yes  .  Vaginal discharge   . Smoker   . Asthma, chronic, mild intermittent, uncomplicated   . Depressed mood   . Poor compliance      Plan:   Reviewed her health hx/o.   discussed importance of compliance  Labs today, c/t current medication.   Return to discuss depression further ,seek counseling.  Begin diflucan for suspected yeast vaginitis (declines testing or exam today), begin Lamisil cream for tinea corporis.    Advised smoking cessation  [name] was seen today for new pt. establish care and a dm and pt. states when sugars run high she knows she has a yeast in.  Diagnoses and all orders for this visit:  Diabetes type 2, uncontrolled Orders: -     Comprehensive metabolic panel -     CBC -     Hemoglobin A1c  Vaginal discharge  Smoker  Asthma, chronic, mild intermittent, uncomplicated  Depressed mood  Poor compliance  Other orders -     Discontinue: fluconazole (DIFLUCAN) 150 MG tablet; Take 1 tablet (150 mg total) by mouth once. -     terbinafine (LAMISIL AT) 1 % cream; Apply 1 application topically 2 (two) times daily. -     fluconazole (DIFLUCAN) 150 MG tablet; 1 tablet weekly

## 2015-06-18 ENCOUNTER — Other Ambulatory Visit: Payer: Self-pay | Admitting: Medical

## 2015-06-18 MED ORDER — LINAGLIPTIN-METFORMIN HCL 2.5-1000 MG PO TABS: 1.0000 | ORAL_TABLET | Freq: Two times a day (BID) | ORAL | Status: DC

## 2015-12-28 ENCOUNTER — Ambulatory Visit: Payer: Managed Care, Other (non HMO) | Admitting: Gynecology

## 2015-12-30 ENCOUNTER — Telehealth: Payer: Self-pay | Admitting: Medical

## 2015-12-30 NOTE — Telephone Encounter (Signed)
Needs appt for diabetes f/u

## 2015-12-31 NOTE — Telephone Encounter (Signed)
Made appt for Monday and also coming in for heel and shoulder pain!!

## 2016-01-01 ENCOUNTER — Ambulatory Visit (INDEPENDENT_AMBULATORY_CARE_PROVIDER_SITE_OTHER): Payer: Managed Care, Other (non HMO) | Admitting: Gynecology

## 2016-01-01 ENCOUNTER — Encounter: Payer: Self-pay | Admitting: Gynecology

## 2016-01-01 ENCOUNTER — Other Ambulatory Visit (HOSPITAL_COMMUNITY)
Admission: RE | Admit: 2016-01-01 | Discharge: 2016-01-01 | Disposition: A | Discharge status: Home / Self Care | Payer: Managed Care, Other (non HMO) | Source: Ambulatory Visit | Attending: Gynecology | Admitting: Gynecology

## 2016-01-01 VITALS — BP 126/78 | Ht 62.0 in | Wt 132.0 lb

## 2016-01-01 DIAGNOSIS — Z01419 Encounter for gynecological examination (general) (routine) without abnormal findings: Secondary | ICD-10-CM | POA: Diagnosis not present

## 2016-01-01 DIAGNOSIS — Z113 Encounter for screening for infections with a predominantly sexual mode of transmission: Secondary | ICD-10-CM | POA: Diagnosis not present

## 2016-01-01 DIAGNOSIS — N898 Other specified noninflammatory disorders of vagina: Secondary | ICD-10-CM

## 2016-01-01 DIAGNOSIS — Z30431 Encounter for routine checking of intrauterine contraceptive device: Secondary | ICD-10-CM | POA: Diagnosis not present

## 2016-01-01 DIAGNOSIS — A499 Bacterial infection, unspecified: Secondary | ICD-10-CM | POA: Diagnosis not present

## 2016-01-01 DIAGNOSIS — N760 Acute vaginitis: Secondary | ICD-10-CM | POA: Diagnosis not present

## 2016-01-01 DIAGNOSIS — Z1151 Encounter for screening for human papillomavirus (HPV): Secondary | ICD-10-CM | POA: Diagnosis present

## 2016-01-01 DIAGNOSIS — B9689 Other specified bacterial agents as the cause of diseases classified elsewhere: Secondary | ICD-10-CM

## 2016-01-01 LAB — WET PREP FOR TRICH, YEAST, CLUE
TRICH WET PREP: NONE SEEN
Yeast Wet Prep HPF POC: NONE SEEN

## 2016-01-01 MED ORDER — TINIDAZOLE 500 MG PO TABS: ORAL_TABLET | ORAL | Status: DC

## 2016-01-01 NOTE — Patient Instructions (Signed)
Tinidazole tablets What is this medicine? TINIDAZOLE (tye NI da zole) is an antiinfective. It is used to treat amebiasis, giardiasis, trichomoniasis, and vaginosis. It will not work for colds, flu, or other viral infections. This medicine may be used for other purposes; ask your health care provider or pharmacist if you have questions. What should I tell my health care provider before I take this medicine? They need to know if you have any of these conditions: -anemia or other blood disorders -if you frequently drink alcohol containing drinks -receiving hemodialysis -seizure disorder -an unusual or allergic reaction to tinidazole, other medicines, foods, dyes, or preservatives -pregnant or trying to get pregnant -breast-feeding How should I use this medicine? Take this medicine by mouth with a full glass of water. Follow the directions on the prescription label. Take with food. Take your medicine at regular intervals. Do not take your medicine more often than directed. Take all of your medicine as directed even if you think you are better. Do not skip doses or stop your medicine early. Talk to your pediatrician regarding the use of this medicine in children. While this drug may be prescribed for children as young as 3 years of age for selected conditions, precautions do apply. Overdosage: If you think you have taken too much of this medicine contact a poison control center or emergency room at once. NOTE: This medicine is only for you. Do not share this medicine with others. What if I miss a dose? If you miss a dose, take it as soon as you can. If it is almost time for your next dose, take only that dose. Do not take double or extra doses. What may interact with this medicine? Do not take this medicine with any of the following medications: -alcohol or any product that contains alcohol -amprenavir oral solution -disulfiram -paclitaxel injection -ritonavir oral solution -sertraline oral  solution -sulfamethoxazole-trimethoprim injection This medicine may also interact with the following medications: -cholestyramine -cimetidine -conivaptan -cyclosporin -fluorouracil -fosphenytoin, phenytoin -ketoconazole -lithium -phenobarbital -tacrolimus -warfarin This list may not describe all possible interactions. Give your health care provider a list of all the medicines, herbs, non-prescription drugs, or dietary supplements you use. Also tell them if you smoke, drink alcohol, or use illegal drugs. Some items may interact with your medicine. What should I watch for while using this medicine? Tell your doctor or health care professional if your symptoms do not improve or if they get worse. Avoid alcoholic drinks while you are taking this medicine and for three days afterward. Alcohol may make you feel dizzy, sick, or flushed. If you are being treated for a sexually transmitted disease, avoid sexual contact until you have finished your treatment. Your sexual partner may also need treatment. What side effects may I notice from receiving this medicine? Side effects that you should report to your doctor or health care professional as soon as possible: -allergic reactions like skin rash, itching or hives, swelling of the face, lips, or tongue -breathing problems -confusion, depression -dark or white patches in the mouth -feeling faint or lightheaded, falls -fever, infection -numbness, tingling, pain or weakness in the hands or feet -pain when passing urine -seizures -unusually weak or tired -vaginal irritation or discharge -vomiting Side effects that usually do not require medical attention (report to your doctor or health care professional if they continue or are bothersome): -dark brown or reddish urine -diarrhea -headache -loss of appetite -metallic taste -nausea -stomach upset This list may not describe all possible side effects. Call   your doctor for medical advice about  side effects. You may report side effects to FDA at 1-800-FDA-1088. Where should I keep my medicine? Keep out of the reach of children. Store at room temperature between 15 and 30 degrees C (59 and 86 degrees F). Protect from light and moisture. Keep container tightly closed. Throw away any unused medicine after the expiration date. NOTE: This sheet is a summary. It may not cover all possible information. If you have questions about this medicine, talk to your doctor, pharmacist, or health care provider.    2016, Elsevier/Gold Standard. (2008-08-18 15:22:28) Bacterial Vaginosis Bacterial vaginosis is a vaginal infection that occurs when the normal balance of bacteria in the vagina is disrupted. It results from an overgrowth of certain bacteria. This is the most common vaginal infection in women of childbearing age. Treatment is important to prevent complications, especially in pregnant women, as it can cause a premature delivery. CAUSES  Bacterial vaginosis is caused by an increase in harmful bacteria that are normally present in smaller amounts in the vagina. Several different kinds of bacteria can cause bacterial vaginosis. However, the reason that the condition develops is not fully understood. RISK FACTORS Certain activities or behaviors can put you at an increased risk of developing bacterial vaginosis, including:  Having a new sex partner or multiple sex partners.  Douching.  Using an intrauterine device (IUD) for contraception. Women do not get bacterial vaginosis from toilet seats, bedding, swimming pools, or contact with objects around them. SIGNS AND SYMPTOMS  Some women with bacterial vaginosis have no signs or symptoms. Common symptoms include:  Grey vaginal discharge.  A fishlike odor with discharge, especially after sexual intercourse.  Itching or burning of the vagina and vulva.  Burning or pain with urination. DIAGNOSIS  Your health care provider will take a medical  history and examine the vagina for signs of bacterial vaginosis. A sample of vaginal fluid may be taken. Your health care provider will look at this sample under a microscope to check for bacteria and abnormal cells. A vaginal pH test may also be done.  TREATMENT  Bacterial vaginosis may be treated with antibiotic medicines. These may be given in the form of a pill or a vaginal cream. A second round of antibiotics may be prescribed if the condition comes back after treatment. Because bacterial vaginosis increases your risk for sexually transmitted diseases, getting treated can help reduce your risk for chlamydia, gonorrhea, HIV, and herpes. HOME CARE INSTRUCTIONS   Only take over-the-counter or prescription medicines as directed by your health care provider.  If antibiotic medicine was prescribed, take it as directed. Make sure you finish it even if you start to feel better.  Tell all sexual partners that you have a vaginal infection. They should see their health care provider and be treated if they have problems, such as a mild rash or itching.  During treatment, it is important that you follow these instructions:  Avoid sexual activity or use condoms correctly.  Do not douche.  Avoid alcohol as directed by your health care provider.  Avoid breastfeeding as directed by your health care provider. SEEK MEDICAL CARE IF:   Your symptoms are not improving after 3 days of treatment.  You have increased discharge or pain.  You have a fever. MAKE SURE YOU:   Understand these instructions.  Will watch your condition.  Will get help right away if you are not doing well or get worse. FOR MORE INFORMATION  Centers for Disease   Control and Prevention, Division of STD Prevention: www.cdc.gov/std American Sexual Health Association (ASHA): www.ashastd.org    This information is not intended to replace advice given to you by your health care provider. Make sure you discuss any questions you have  with your health care provider.   Document Released: 11/21/2005 Document Revised: 12/12/2014 Document Reviewed: 07/03/2013 Elsevier Interactive Patient Education 2016 Elsevier Inc.  

## 2016-01-01 NOTE — Progress Notes (Signed)
Bailey Stevenson Sep 20, 1971 [id number]   History:    45 y.o.  for annual gyn exam who has not been seen in the office since 2012. She has been followed by her PCP Dr. Aleen Campi who has been treating her for type 2 diabetes and has been doing her blood work. She was complaining of a clear vaginal discharge for several weeks now. Some slight odor. She is sexually active. Review of her old records indicated she had a Mirena IUD placed in favor 2013. Patient denied any prior history of any abnormal Pap smears. Patient has not had a baseline mammogram yet. She had a negative HIV test in 2016.  Past medical history,surgical history, family history and social history were all reviewed and documented in the EPIC chart.  Gynecologic History Patient's last menstrual period was 12/18/2015. Contraception: IUD Last Pap: 2012. Results were: normal Last mammogram: No previous study. Results were: No previous study  Obstetric History OB History  Gravida Para Term Preterm AB SAB TAB Ectopic Multiple Living  # Outcome Date GA Lbr Len/2nd Weight Sex Delivery Anes PTL Lv  1 Term     M CS-Unspec  N Y       ROS: A ROS was performed and pertinent positives and negatives are included in the history.  GENERAL: No fevers or chills. HEENT: No change in vision, no earache, sore throat or sinus congestion. NECK: No pain or stiffness. CARDIOVASCULAR: No chest pain or pressure. No palpitations. PULMONARY: No shortness of breath, cough or wheeze. GASTROINTESTINAL: No abdominal pain, nausea, vomiting or diarrhea, melena or bright red blood per rectum. GENITOURINARY: No urinary frequency, urgency, hesitancy or dysuria. MUSCULOSKELETAL: No joint or muscle pain, no back pain, no recent trauma. DERMATOLOGIC: No rash, no itching, no lesions. ENDOCRINE: No polyuria, polydipsia, no heat or cold intolerance. No recent change in weight. HEMATOLOGICAL: No anemia or easy bruising or bleeding. NEUROLOGIC: No  headache, seizures, numbness, tingling or weakness. PSYCHIATRIC: No depression, no loss of interest in normal activity or change in sleep pattern.     Exam: chaperone present  BP 126/78 mmHg  Ht  (1.575 m)  Wt 132 lb (59.875 kg)  BMI 24.14 kg/m2  LMP 12/18/2015  Body mass index is 24.14 kg/(m^2).  General appearance : Well developed well nourished female. No acute distress HEENT: Eyes: no retinal hemorrhage or exudates,  Neck supple, trachea midline, no carotid bruits, no thyroidmegaly Lungs: Clear to auscultation, no rhonchi or wheezes, or rib retractions  Heart: Regular rate and rhythm, no murmurs or gallops Breast:Examined in sitting and supine position were symmetrical in appearance, no palpable masses or tenderness,  no skin retraction, no nipple inversion, no nipple discharge, no skin discoloration, no axillary or supraclavicular lymphadenopathy Abdomen: no palpable masses or tenderness, no rebound or guarding Extremities: no edema or skin discoloration or tenderness  Pelvic:  Bartholin, Urethra, Skene Glands: Within normal limits             Vagina: No gross lesions or discharge  Cervix: No gross lesions or discharge, IUD string was not visualized  Uterus  anteverted, normal size, shape and consistency, non-tender and mobile  Adnexa  Without masses or tenderness  Anus and perineum  normal   Rectovaginal  normal sphincter tone without palpated masses or tenderness             Hemoccult not indicated   Wet prep: Moderate clue cells,  few white blood cells, too numerous to count bacteria  Assessment/Plan:  45 y.o. female for annual exam will return back to the office next week for an ultrasound to confirm that the IUD is still in the proper position since it was placed back in 2013. GC and Chlamydia culture was obtained result pending at time of this dictation. Prescription for Tindamax 500 mg patient to take 4 tablets today and repeat 4 tablets tomorrow. Her PCP is been  doing her blood work. Patient was provided with a requisition to schedule her mammogram.   Ok Edwards MD, 9:40 AM 01/01/2016

## 2016-01-02 LAB — GC/CHLAMYDIA PROBE AMP
CT Probe RNA: NOT DETECTED
GC Probe RNA: NOT DETECTED

## 2016-01-04 ENCOUNTER — Encounter: Payer: Self-pay | Admitting: Medical

## 2016-01-04 ENCOUNTER — Ambulatory Visit (INDEPENDENT_AMBULATORY_CARE_PROVIDER_SITE_OTHER): Payer: Managed Care, Other (non HMO) | Admitting: Medical

## 2016-01-04 ENCOUNTER — Encounter: Payer: Self-pay | Admitting: Gynecology

## 2016-01-04 VITALS — BP 128/80 | HR 107 | Wt 130.0 lb

## 2016-01-04 DIAGNOSIS — R748 Abnormal levels of other serum enzymes: Secondary | ICD-10-CM

## 2016-01-04 DIAGNOSIS — E118 Type 2 diabetes mellitus with unspecified complications: Secondary | ICD-10-CM | POA: Diagnosis not present

## 2016-01-04 DIAGNOSIS — Z7189 Other specified counseling: Secondary | ICD-10-CM | POA: Diagnosis not present

## 2016-01-04 DIAGNOSIS — Z7185 Encounter for immunization safety counseling: Secondary | ICD-10-CM

## 2016-01-04 DIAGNOSIS — M79671 Pain in right foot: Secondary | ICD-10-CM

## 2016-01-04 DIAGNOSIS — E1165 Type 2 diabetes mellitus with hyperglycemia: Secondary | ICD-10-CM | POA: Diagnosis not present

## 2016-01-04 DIAGNOSIS — M25511 Pain in right shoulder: Secondary | ICD-10-CM | POA: Diagnosis not present

## 2016-01-04 DIAGNOSIS — IMO0002 Reserved for concepts with insufficient information to code with codable children: Secondary | ICD-10-CM

## 2016-01-04 LAB — COMPREHENSIVE METABOLIC PANEL
ALT: 41 U/L — AB (ref 6–29)
AST: 24 U/L (ref 10–30)
Albumin: 3.8 g/dL (ref 3.6–5.1)
Alkaline Phosphatase: 126 U/L — ABNORMAL HIGH (ref 33–115)
BILIRUBIN TOTAL: 0.4 mg/dL (ref 0.2–1.2)
BUN: 9 mg/dL (ref 7–25)
CALCIUM: 9.3 mg/dL (ref 8.6–10.2)
CO2: 24 mmol/L (ref 20–31)
CREATININE: 0.67 mg/dL (ref 0.50–1.10)
Chloride: 103 mmol/L (ref 98–110)
Glucose, Bld: 184 mg/dL — ABNORMAL HIGH (ref 65–99)
POTASSIUM: 4.2 mmol/L (ref 3.5–5.3)
Sodium: 136 mmol/L (ref 135–146)
Total Protein: 6.5 g/dL (ref 6.1–8.1)

## 2016-01-04 LAB — CBC
HEMATOCRIT: 46.3 % — AB (ref 36.0–46.0)
HEMOGLOBIN: 16.1 g/dL — AB (ref 12.0–15.0)
MCH: 32.4 pg (ref 26.0–34.0)
MCHC: 34.8 g/dL (ref 30.0–36.0)
MCV: 93.2 fL (ref 78.0–100.0)
MPV: 10.1 fL (ref 8.6–12.4)
Platelets: 207 10*3/uL (ref 150–400)
RBC: 4.97 MIL/uL (ref 3.87–5.11)
RDW: 13.5 % (ref 11.5–15.5)
WBC: 5.9 10*3/uL (ref 4.0–10.5)

## 2016-01-04 LAB — LIPID PANEL
Cholesterol: 172 mg/dL (ref 125–200)
HDL: 55 mg/dL (ref >=46)
LDL CALC: 100 mg/dL (ref <130)
TRIGLYCERIDES: 85 mg/dL (ref <150)
Total CHOL/HDL Ratio: 3.1 Ratio (ref <=5.0)
VLDL: 17 mg/dL (ref <30)

## 2016-01-04 LAB — CYTOLOGY - PAP

## 2016-01-04 LAB — HEMOGLOBIN A1C
HEMOGLOBIN A1C: 9.2 % — AB (ref <5.7)
MEAN PLASMA GLUCOSE: 217 mg/dL — AB (ref <117)

## 2016-01-04 NOTE — Progress Notes (Signed)
Subjective: Chief Complaint  Patient presents with  . Diabetes    fasting sugars are around 130. 102 lowest sugar and 150 the highest. been checking her feet, but she said that when she sits down her rt heel feels like it has a "real bad cramp"  . shoulder pain    rt shoulder. said that it starts randomly. said sometimes it pops.    Diabetes - checking glucose at least BID.   Sugars been running 102 lowest, 150 highest.  Compliant with Jentadueto 1 tablet BID but was on metformin prior to 06/2015.   The only other medication currently is IUD.  Checks feet for wounds Sherryl Barters.  Does get some heel pain from time to time.   Feels like a bad cramp.   Denies numbness or burning pains of feet.     Right shoulder pain - can be random, pops at times.  denies injury, fall, no trauma.  Hard to lift overhead at times.       Works in Personnel officer at [employer]    No other c/o  Past Medical History  Diagnosis Date  . Diabetes mellitus     TYPE II  . Asthma   . Pneumonia    ROS as in subjective   Objective: BP 128/80 mmHg  Pulse 107  Wt 130 lb (58.968 kg)  SpO2 99%  LMP 12/18/2015 (Exact Date)  General appearance: alert, no distress, WD/WN, AA female Oral cavity: MMM, no lesions Neck: supple, no lymphadenopathy, no thyromegaly, no masses Heart: RRR, normal S1, S2, no murmurs Lungs: CTA bilaterally, no wheezes, rhonchi, or rales Ext: no edema Pulses: 2+ symmetric, upper and lower extremities, normal cap refill See separate foot exam  Diabetic Foot Exam - Simple   Simple Foot Form  Diabetic Foot exam was performed with the following findings:  Yes 01/04/2016 10:49 AM  Visual Inspection  No deformities, no ulcerations, no other skin breakdown bilaterally:  Yes  Sensation Testing  Intact to touch and monofilament testing bilaterally:  Yes  Pulse Check  Posterior Tibialis and Dorsalis pulse intact bilaterally:  Yes  Comments       Assessment: Encounter Diagnoses  Name  Primary?  Marland Kitchen Uncontrolled type 2 diabetes mellitus with complication, without long-term current use of insulin (HCC) Yes  . Alkaline phosphatase elevation   . Right shoulder pain   . Heel pain, right   . Vaccine counseling       Plan: diabetes type 2 - discussed importance of routine visit, eye doctor visit yearly, daily foot checks, vaccinations, medication compliance, glucose monitoring.  Labs today, c/t Jentadueto ALP elevated - repeat labs today Right shoulder pain - likely inflammation vs bursitis.  Begin aleve, relative rest,ice, avoid overhead motion.  Call if not much improved within a week Heel pain - begin heel cups.  F/u if not much improved within a few weeks Discussed vaccine counseling. She will consider and let me know.  Allergic to eggs, so declines flu shot.  Advised Tdap, pneumococcal 23, Hep B vaccination.

## 2016-01-04 NOTE — Patient Instructions (Signed)
Heel pain  Begin wearing heel cups over the counter for both shoes for the next month.    Lets see if heel cups resolves the pain  Use good supportive shoes at work  Shoulder pain  Likely inflammation or bursitis  Begin Aleve over the counter 1-2 tablets daily for the next week  Use bag of frozen peas for ice/cooling, 20 minutes daily  Avoid lots of over head motion until pain resolves  You can use an arm sling to rest the arm for an hour at a time  We will call with lab results

## 2016-01-05 ENCOUNTER — Other Ambulatory Visit: Payer: Self-pay | Admitting: Medical

## 2016-01-05 LAB — VITAMIN D 25 HYDROXY (VIT D DEFICIENCY, FRACTURES): VIT D 25 HYDROXY: 6 ng/mL — AB (ref 30–100)

## 2016-01-05 MED ORDER — DAPAGLIFLOZIN PROPANEDIOL 10 MG PO TABS: 10.0000 mg | ORAL_TABLET | Freq: Every day | ORAL | Status: DC

## 2016-01-05 MED ORDER — LINAGLIPTIN-METFORMIN HCL 2.5-1000 MG PO TABS: 1.0000 | ORAL_TABLET | Freq: Two times a day (BID) | ORAL | Status: DC

## 2016-01-05 MED ORDER — VITAMIN D (ERGOCALCIFEROL) 1.25 MG (50000 UNIT) PO CAPS: 50000.0000 [IU] | ORAL_CAPSULE | ORAL | Status: DC

## 2016-01-06 ENCOUNTER — Encounter: Payer: Self-pay | Admitting: Gynecology

## 2016-01-06 ENCOUNTER — Other Ambulatory Visit: Payer: Self-pay | Admitting: Gynecology

## 2016-01-06 ENCOUNTER — Ambulatory Visit (INDEPENDENT_AMBULATORY_CARE_PROVIDER_SITE_OTHER): Payer: Managed Care, Other (non HMO)

## 2016-01-06 ENCOUNTER — Ambulatory Visit (INDEPENDENT_AMBULATORY_CARE_PROVIDER_SITE_OTHER): Payer: Managed Care, Other (non HMO) | Admitting: Gynecology

## 2016-01-06 VITALS — BP 132/80 | Ht 62.0 in | Wt 130.0 lb

## 2016-01-06 DIAGNOSIS — N83201 Unspecified ovarian cyst, right side: Secondary | ICD-10-CM

## 2016-01-06 DIAGNOSIS — Z30431 Encounter for routine checking of intrauterine contraceptive device: Secondary | ICD-10-CM

## 2016-01-06 NOTE — Progress Notes (Addendum)
   Patient is a 45 year old was seen the office on January 27 for her annual gynecological examination. She not been seen the office since 2012. She has been followed by her PCP Dr. Aleen Campi for her type 2 diabetes. At that last office visit she was no vaginal discharge and wanted to have an STD screen as well. Review of her record indicated she had a Mirena IUD placed back in 2013. Her recent Pap smear and STD screen were all negative. At the time of her exam the IUD string was not visualized and she was here for an ultrasound to confirm that the IUD is in place.  Ultrasound today: Uterus measures 7.3 x 6.1 x 5.7 cm with endometrial stripe of 4.8 mm. The IUD was seen in the normal position. Left ovary was normal. Right ovary with 2 thinwall echo-free simple cyst avascular measuring 3.8 x 2.4 x 3.2 cm average size 3.1 cm and the second ovarian cyst measuring 2.7 x 3.1 x 2.0 cm. No fluid in the cul-de-sac.  Review of old past record indicated that back in 2010 patient had a small right ovarian cyst measuring 21 x 15 x 12 mm.  Assessment/plan: IUD in the proper position in the uterine cavity. Incidental finding of 2 small cyst on the right ovary. Patient was reassured the appears appears to be benign nevertheless we'll check a CA 125 today and have patient return back in 4 months for follow-up ultrasound.

## 2016-01-06 NOTE — Patient Instructions (Signed)
CA-125 Tumor Marker Test WHY AM I HAVING THIS TEST? This test is used to check the level of cancer antigen 125 (CA-125) in your blood. The CA-125 tumor marker test can be helpful in detecting ovarian cancer. The test is only performed if you are considered at high risk for ovarian cancer. Your health care provider may recommend this test if:  You have a strong family history of ovarian cancer.  You have a breast cancer antigen (BRCA) genetic defect. If you have already been diagnosed with ovarian cancer, your health care provider may use this test to help identify the extent of the disease and to monitor your response to treatment. WHAT KIND OF SAMPLE IS TAKEN? A blood sample is required for this test. It is usually collected by inserting a needle into a vein. HOW DO I PREPARE FOR THE TEST? There is no preparation required for this test. WHAT ARE THE REFERENCE RANGES? Reference ranges are considered healthy ranges established after testing a large group of healthy people. Reference ranges may vary among different people, labs, and hospitals. It is your responsibility to obtain your test results. Ask the lab or department performing the test when and how you will get your results. The reference range for this test is 0-35 units/mL or less than 35 kunits/L (SI units). WHAT DO THE RESULTS MEAN? Increased levels of CA-125 may indicate:  Certain types of cancer, including:  Ovarian cancer.  Pancreatic cancer.  Colon cancer.  Lung cancer.  Breast cancer.  Lymphoma.  Noncancerous (benign) disorders, including:  Cirrhosis.  Pregnancy.  Endometriosis.  Pancreatitis.  Pelvic inflammatory disease (PID). Talk with your health care provider to discuss your results, treatment options, and if necessary, the need for more tests. Talk with your health care provider if you have any questions about your results.   This information is not intended to replace advice given to you by your  health care provider. Make sure you discuss any questions you have with your health care provider.   Document Released: 12/13/2004 Document Revised: 12/12/2014 Document Reviewed: 04/10/2014 Elsevier Interactive Patient Education 2016 Elsevier Inc. Ovarian Cyst An ovarian cyst is a fluid-filled sac that forms on an ovary. The ovaries are small organs that produce eggs in women. Various types of cysts can form on the ovaries. Most are not cancerous. Many do not cause problems, and they often go away on their own. Some may cause symptoms and require treatment. Common types of ovarian cysts include:  Functional cysts--These cysts may occur every month during the menstrual cycle. This is normal. The cysts usually go away with the next menstrual cycle if the woman does not get pregnant. Usually, there are no symptoms with a functional cyst.  Endometrioma cysts--These cysts form from the tissue that lines the uterus. They are also called "chocolate cysts" because they become filled with blood that turns brown. This type of cyst can cause pain in the lower abdomen during intercourse and with your menstrual period.  Cystadenoma cysts--This type develops from the cells on the outside of the ovary. These cysts can get very big and cause lower abdomen pain and pain with intercourse. This type of cyst can twist on itself, cut off its blood supply, and cause severe pain. It can also easily rupture and cause a lot of pain.  Dermoid cysts--This type of cyst is sometimes found in both ovaries. These cysts may contain different kinds of body tissue, such as skin, teeth, hair, or cartilage. They usually do not cause  symptoms unless they get very big.  Theca lutein cysts--These cysts occur when too much of a certain hormone (human chorionic gonadotropin) is produced and overstimulates the ovaries to produce an egg. This is most common after procedures used to assist with the conception of a baby (in vitro  fertilization). CAUSES   Fertility drugs can cause a condition in which multiple large cysts are formed on the ovaries. This is called ovarian hyperstimulation syndrome.  A condition called polycystic ovary syndrome can cause hormonal imbalances that can lead to nonfunctional ovarian cysts. SIGNS AND SYMPTOMS  Many ovarian cysts do not cause symptoms. If symptoms are present, they may include:  Pelvic pain or pressure.  Pain in the lower abdomen.  Pain during sexual intercourse.  Increasing girth (swelling) of the abdomen.  Abnormal menstrual periods.  Increasing pain with menstrual periods.  Stopping having menstrual periods without being pregnant. DIAGNOSIS  These cysts are commonly found during a routine or annual pelvic exam. Tests may be ordered to find out more about the cyst. These tests may include:  Ultrasound.  X-ray of the pelvis.  CT scan.  MRI.  Blood tests. TREATMENT  Many ovarian cysts go away on their own without treatment. Your health care provider may want to check your cyst regularly for 2-3 months to see if it changes. For women in menopause, it is particularly important to monitor a cyst closely because of the higher rate of ovarian cancer in menopausal women. When treatment is needed, it may include any of the following:  A procedure to drain the cyst (aspiration). This may be done using a long needle and ultrasound. It can also be done through a laparoscopic procedure. This involves using a thin, lighted tube with a tiny camera on the end (laparoscope) inserted through a small incision.  Surgery to remove the whole cyst. This may be done using laparoscopic surgery or an open surgery involving a larger incision in the lower abdomen.  Hormone treatment or birth control pills. These methods are sometimes used to help dissolve a cyst. HOME CARE INSTRUCTIONS   Only take over-the-counter or prescription medicines as directed by your health care  provider.  Follow up with your health care provider as directed.  Get regular pelvic exams and Pap tests. SEEK MEDICAL CARE IF:   Your periods are late, irregular, or painful, or they stop.  Your pelvic pain or abdominal pain does not go away.  Your abdomen becomes larger or swollen.  You have pressure on your bladder or trouble emptying your bladder completely.  You have pain during sexual intercourse.  You have feelings of fullness, pressure, or discomfort in your stomach.  You lose weight for no apparent reason.  You feel generally ill.  You become constipated.  You lose your appetite.  You develop acne.  You have an increase in body and facial hair.  You are gaining weight, without changing your exercise and eating habits.  You think you are pregnant. SEEK IMMEDIATE MEDICAL CARE IF:   You have increasing abdominal pain.  You feel sick to your stomach (nauseous), and you throw up (vomit).  You develop a fever that comes on suddenly.  You have abdominal pain during a bowel movement.  Your menstrual periods become heavier than usual. MAKE SURE YOU:  Understand these instructions.  Will watch your condition.  Will get help right away if you are not doing well or get worse.   This information is not intended to replace advice given to  you by your health care provider. Make sure you discuss any questions you have with your health care provider.   Document Released: 11/21/2005 Document Revised: 11/26/2013 Document Reviewed: 07/29/2013 Elsevier Interactive Patient Education [employer].

## 2016-01-07 LAB — CA 125: CA 125: 21 U/mL (ref <35)

## 2016-01-08 ENCOUNTER — Telehealth: Payer: Self-pay | Admitting: Medical

## 2016-01-19 NOTE — Telephone Encounter (Signed)
Cover my meds states P.A. Not needed.  Called pharmacy & went thru for $15

## 2016-01-21 ENCOUNTER — Telehealth: Payer: Self-pay

## 2016-01-21 NOTE — Telephone Encounter (Signed)
Pt is aware that flublock is here and will come get it as soon as she can

## 2016-02-20 ENCOUNTER — Emergency Department (HOSPITAL_COMMUNITY)
Admission: EM | Admit: 2016-02-20 | Discharge: 2016-02-20 | Disposition: A | Discharge status: Home / Self Care | Payer: Managed Care, Other (non HMO) | Source: Home / Self Care | Attending: Family Medicine | Admitting: Family Medicine

## 2016-02-20 ENCOUNTER — Encounter (HOSPITAL_COMMUNITY): Payer: Self-pay | Admitting: Emergency Medicine

## 2016-02-20 DIAGNOSIS — J069 Acute upper respiratory infection, unspecified: Secondary | ICD-10-CM | POA: Diagnosis not present

## 2016-02-20 DIAGNOSIS — A499 Bacterial infection, unspecified: Secondary | ICD-10-CM | POA: Diagnosis not present

## 2016-02-20 DIAGNOSIS — J329 Chronic sinusitis, unspecified: Secondary | ICD-10-CM | POA: Diagnosis not present

## 2016-02-20 DIAGNOSIS — B9689 Other specified bacterial agents as the cause of diseases classified elsewhere: Secondary | ICD-10-CM

## 2016-02-20 MED ORDER — AZITHROMYCIN 250 MG PO TABS: 250.0000 mg | ORAL_TABLET | Freq: Every day | ORAL | Status: DC

## 2016-02-20 MED ORDER — MOMETASONE FUROATE 50 MCG/ACT NA SUSP: 2.0000 | Freq: Every day | NASAL | Status: DC

## 2016-02-20 NOTE — ED Provider Notes (Signed)
CSN: [id number]     Arrival date & time 02/20/16  1941 History   First MD Initiated Contact with Patient 02/20/16 2040     Chief Complaint  Patient presents with  . Sinus Problem  . URI   (Consider location/radiation/quality/duration/timing/severity/associated sxs/prior Treatment) Patient is a 45 y.o. female presenting with sinus complaint and URI. The history is provided by the patient. No language interpreter was used.  Sinus Problem This is a recurrent problem. The current episode started more than 2 days ago. The problem occurs constantly. The problem has been gradually worsening. Associated symptoms include headaches and shortness of breath. Pertinent negatives include no chest pain and no abdominal pain. Associated symptoms comments: Pressure on her face. Some cough. She has had sinus problem for years but worsened in the last two days. Nothing aggravates the symptoms. Nothing relieves the symptoms.  URI Presenting symptoms: congestion, cough and fever   Presenting symptoms: no ear pain and no sore throat   Severity:  Moderate Duration:  2 days Timing:  Constant Progression:  Worsening Chronicity:  Recurrent Relieved by:  Nothing Worsened by:  Nothing tried Ineffective treatments: Clarithin, Mucinex.inhaler. Mucinex helps some for the mucus. Associated symptoms: headaches   Associated symptoms: no sneezing   Risk factors: sick contacts   Risk factors comment:  4 people at work sick. She did not get her flu shot   Past Medical History  Diagnosis Date  . Diabetes mellitus     TYPE II  . Asthma   . Pneumonia    Past Surgical History  Procedure Laterality Date  . Cholecystectomy    . Cesarean section     Family History  Problem Relation Age of Onset  . Diabetes Maternal Grandmother   . Breast cancer Maternal Grandmother   . Hypertension Mother   . Diabetes Mother   . Kidney failure Mother   . Hypertension Father   . Diabetes Father   . Stroke Father   .  Hypertension Maternal Aunt   . Diabetes Maternal Aunt   . Cancer Maternal Aunt     throat  . Hypertension Maternal Uncle   . Diabetes Maternal Uncle   . Hypertension Paternal Aunt   . Diabetes Paternal Aunt   . Hypertension Paternal Uncle   . Diabetes Paternal Uncle    Social History  Substance Use Topics  . Smoking status: Current Every Day Smoker -- 0.25 packs/day for 9 years    Types: Cigarettes  . Smokeless tobacco: Never Used  . Alcohol Use: No   OB History    Gravida Para Term Preterm AB TAB SAB Ectopic Multiple Living   Review of Systems  Constitutional: Positive for fever.  HENT: Positive for congestion. Negative for ear discharge, ear pain, sneezing, sore throat and trouble swallowing.   Respiratory: Positive for cough and shortness of breath.   Cardiovascular: Negative.  Negative for chest pain.  Gastrointestinal: Negative for abdominal pain.  Neurological: Positive for headaches.  All other systems reviewed and are negative.   Allergies  Shellfish allergy and Eggs or egg-derived products  Home Medications   Prior to Admission medications   Medication Sig Start Date End Date Taking? Authorizing Provider  dapagliflozin propanediol (FARXIGA) 10 MG TABS tablet Take 10 mg by mouth daily. 01/05/16   Kermit Balo Tysinger, PA-C  Linagliptin-Metformin HCl 2.04-999 MG TABS Take 1 tablet by mouth 2 (two) times daily. 01/05/16   Onalee Hua  S Tysinger, PA-C  Vitamin D, Ergocalciferol, (DRISDOL) 50000 units CAPS capsule Take 1 capsule (50,000 Units total) by mouth every 7 (seven) days. 01/05/16   Jac Canavanavid S Tysinger, PA-C   Meds Ordered and Administered this Visit  Medications - No data to display  BP 130/78 mmHg  Pulse 103  Temp(Src) 99.5 F (37.5 C) (Oral)  Resp 16  SpO2 99% No data found.   Physical Exam  Constitutional: She is oriented to person, place, and time. She appears well-developed. No distress.  HENT:  Head: Normocephalic.  Right Ear: Tympanic  membrane, external ear and ear canal normal.  Left Ear: Tympanic membrane, external ear and ear canal normal.  Nose: Right sinus exhibits maxillary sinus tenderness. Left sinus exhibits maxillary sinus tenderness.    Mouth/Throat: Uvula is midline, oropharynx is clear and moist and mucous membranes are normal.  Cardiovascular: Normal rate, regular rhythm and normal heart sounds.   No murmur heard. Pulmonary/Chest: Effort normal and breath sounds normal. No respiratory distress. She has no wheezes.  Neurological: She is alert and oriented to person, place, and time.  Nursing note and vitals reviewed.   ED Course  Procedures (including critical care time)  Labs Review Labs Reviewed - No data to display  Imaging Review No results found.   Visual Acuity Review  Right Eye Distance:   Left Eye Distance:   Bilateral Distance:    Right Eye Near:   Left Eye Near:    Bilateral Near:         MDM  No diagnosis found. Sinusitis, bacterial  URI, acute  Likely acute on chronic sinusitis. Zpak prescribed. May use nasal saline rinse. Nasonex also prescribed. Continue home inhaler and Mucinex prn cough. F/U as needed.    Doreene ElandKehinde T Joelle Flessner, MD 02/20/16 2050

## 2016-02-20 NOTE — Discharge Instructions (Signed)
Sinusitis, Adult  Sinusitis is redness, soreness, and puffiness (inflammation) of the air pockets in the bones of your face (sinuses). The redness, soreness, and puffiness can cause air and mucus to get trapped in your sinuses. This can allow germs to grow and cause an infection.   HOME CARE    Drink enough fluids to keep your pee (urine) clear or pale yellow.   Use a humidifier in your home.   Run a hot shower to create steam in the bathroom. Sit in the bathroom with the door closed. Breathe in the steam 3-4 times a day.   Put a warm, moist washcloth on your face 3-4 times a day, or as told by your doctor.   Use salt water sprays (saline sprays) to wet the thick fluid in your nose. This can help the sinuses drain.   Only take medicine as told by your doctor.  GET HELP RIGHT AWAY IF:    Your pain gets worse.   You have very bad headaches.   You are sick to your stomach (nauseous).   You throw up (vomit).   You are very sleepy (drowsy) all the time.   Your face is puffy (swollen).   Your vision changes.   You have a stiff neck.   You have trouble breathing.  MAKE SURE YOU:    Understand these instructions.   Will watch your condition.   Will get help right away if you are not doing well or get worse.     This information is not intended to replace advice given to you by your health care provider. Make sure you discuss any questions you have with your health care provider.     Document Released: 05/09/2008 Document Revised: 12/12/2014 Document Reviewed: 06/26/2012  Elsevier Interactive Patient Education 2016 Elsevier Inc[employer]

## 2016-02-20 NOTE — ED Notes (Signed)
Pt here with sinus/URI/ Bronchitis that started 2 days ago Cough, nasal congestion with sinus pressure, chest tightness and slight wheezing Taking Mucinex with Inhaler Chronic smoker noted Chills noted as well

## 2016-03-23 ENCOUNTER — Ambulatory Visit: Payer: Managed Care, Other (non HMO) | Admitting: Medical

## 2016-03-30 ENCOUNTER — Encounter: Payer: Self-pay | Admitting: Medical

## 2016-05-06 ENCOUNTER — Ambulatory Visit: Payer: Managed Care, Other (non HMO) | Admitting: Gynecology

## 2016-05-06 ENCOUNTER — Other Ambulatory Visit: Payer: Managed Care, Other (non HMO)

## 2016-05-23 ENCOUNTER — Other Ambulatory Visit: Payer: Self-pay | Admitting: Gynecology

## 2016-05-23 ENCOUNTER — Ambulatory Visit (INDEPENDENT_AMBULATORY_CARE_PROVIDER_SITE_OTHER): Payer: Managed Care, Other (non HMO)

## 2016-05-23 ENCOUNTER — Ambulatory Visit (INDEPENDENT_AMBULATORY_CARE_PROVIDER_SITE_OTHER): Payer: Managed Care, Other (non HMO) | Admitting: Gynecology

## 2016-05-23 VITALS — BP 128/84

## 2016-05-23 DIAGNOSIS — R188 Other ascites: Secondary | ICD-10-CM

## 2016-05-23 DIAGNOSIS — L739 Follicular disorder, unspecified: Secondary | ICD-10-CM | POA: Diagnosis not present

## 2016-05-23 DIAGNOSIS — N83201 Unspecified ovarian cyst, right side: Secondary | ICD-10-CM | POA: Diagnosis not present

## 2016-05-23 DIAGNOSIS — N8312 Corpus luteum cyst of left ovary: Secondary | ICD-10-CM | POA: Diagnosis not present

## 2016-05-23 MED ORDER — MEDROXYPROGESTERONE ACETATE 150 MG/ML IM SUSP
150.0000 mg | Freq: Once | INTRAMUSCULAR | Status: AC
  Administered 2016-05-23: 150 mg via INTRAMUSCULAR

## 2016-05-23 MED ORDER — DOXYCYCLINE HYCLATE 100 MG PO CAPS: 100.0000 mg | ORAL_CAPSULE | Freq: Two times a day (BID) | ORAL | Status: DC

## 2016-05-23 NOTE — Progress Notes (Signed)
HPI: Patient is a 45 year old who presented to the office today for follow-up ultrasound a right ovarian cyst. She was seen the office in February of this year. Her history is as follows:  Patient had not been seen the office since 2012 which she was seen in January for her annual exam. Review of her record indicated she had had a Mirena IUD placed in 2013. At the time of her annual exam the IUD string was not seen so the ultrasound was ordered and the following had been noted:   Ultrasound 01/06/2016: Uterus measures 7.3 x 6.1 x 5.7 cm with endometrial stripe of 4.8 mm. The IUD was seen in the normal position. Left ovary was normal. Right ovary with 2 thinwall echo-free simple cyst avascular measuring 3.8 x 2.4 x 3.2 cm average size 3.1 cm and the second ovarian cyst measuring 2.7 x 3.1 x 2.0 cm. No fluid in the cul-de-sac.  Review of old past record indicated that back in 2010 patient had a small right ovarian cyst measuring 21 x 15 x 12 mm.  She is here for follow-up ultrasound and the following was noted:  Uterus measures 7.6 x 5.5 x 4.7 cm with endometrial stripe 6.8 mm. Right ovary thinwall echo-free avascular cyst measuring 31 x 26 x 26 mm average size 28 mm. Right ovary was surrounded by free fluid 50 x 20 mm. Left ovary thick wall corpus luteum cyst with positive color flow in the periphery. Some fluid in the cul-de-sac was noted.  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  ROS: A ROS was performed and pertinent positives and negatives are included in the history.  GENERAL: No fevers or chills. HEENT: No change in vision, no earache, sore throat or sinus congestion. NECK: No pain or stiffness. CARDIOVASCULAR: No chest pain or pressure. No palpitations. PULMONARY: No shortness of breath, cough or wheeze. GASTROINTESTINAL: No abdominal pain, nausea, vomiting or diarrhea, melena or bright red  blood per rectum. GENITOURINARY: No urinary frequency, urgency, hesitancy or dysuria. MUSCULOSKELETAL: No joint or muscle pain, no back pain, no recent trauma. DERMATOLOGIC: Right groin swelling and tender ENDOCRINE: No polyuria, polydipsia, no heat or cold intolerance. No recent change in weight. HEMATOLOGICAL: No anemia or easy bruising or bleeding. NEUROLOGIC: No headache, seizures, numbness, tingling or weakness. PSYCHIATRIC: No depression, no loss of interest in normal activity or change in sleep pattern.   PE: Well-developed well-nourished female with the above complaint Groin region on the right side a 2-1/2 cm slightly raised tender area appears to be folliculitis. Contralateral groin no masses noted. Pelvic exam not done due to the fact that her ultrasound was done today.   Assessment Plan: #1 folliculitis of the right groin will be treated with Vibramycin 100 mg twice a day for 14 days. She is to use antibacterial soap once a day. She is to apply Neosporin antibiotic cream twice a day to the area. If no improvement in 2-3 weeks she'll return back to the office in the event that incision and drainage could be required. #2 history of right ovarian cyst normal CA 125 early this year. The only one collapsing I'm going to give her shot of Depo-Provera 150 mg one by mouth today she will return back in 4 months for follow-up ultrasound.   Greater than 50% of time was spent in counseling and coordinating care of this patient.   Time  of consultation: 15   Minutes.

## 2016-05-23 NOTE — Patient Instructions (Signed)
Doxycycline tablets or capsules What is this medicine? DOXYCYCLINE (dox i SYE kleen) is a tetracycline antibiotic. It kills certain bacteria or stops their growth. It is used to treat many kinds of infections, like dental, skin, respiratory, and urinary tract infections. It also treats acne, Lyme disease, malaria, and certain sexually transmitted infections. This medicine may be used for other purposes; ask your health care provider or pharmacist if you have questions. What should I tell my health care provider before I take this medicine? They need to know if you have any of these conditions: -liver disease -long exposure to sunlight like working outdoors -stomach problems like colitis -an unusual or allergic reaction to doxycycline, tetracycline antibiotics, other medicines, foods, dyes, or preservatives -pregnant or trying to get pregnant -breast-feeding How should I use this medicine? Take this medicine by mouth with a full glass of water. Follow the directions on the prescription label. It is best to take this medicine without food, but if it upsets your stomach take it with food. Take your medicine at regular intervals. Do not take your medicine more often than directed. Take all of your medicine as directed even if you think you are better. Do not skip doses or stop your medicine early. Talk to your pediatrician regarding the use of this medicine in children. While this drug may be prescribed for selected conditions, precautions do apply. Overdosage: If you think you have taken too much of this medicine contact a poison control center or emergency room at once. NOTE: This medicine is only for you. Do not share this medicine with others. What if I miss a dose? If you miss a dose, take it as soon as you can. If it is almost time for your next dose, take only that dose. Do not take double or extra doses. What may interact with this medicine? -antacids -barbiturates -birth control  pills -bismuth subsalicylate -carbamazepine -methoxyflurane -other antibiotics -phenytoin -vitamins that contain iron -warfarin This list may not describe all possible interactions. Give your health care provider a list of all the medicines, herbs, non-prescription drugs, or dietary supplements you use. Also tell them if you smoke, drink alcohol, or use illegal drugs. Some items may interact with your medicine. What should I watch for while using this medicine? Tell your doctor or health care professional if your symptoms do not improve. Do not treat diarrhea with over the counter products. Contact your doctor if you have diarrhea that lasts more than 2 days or if it is severe and watery. Do not take this medicine just before going to bed. It may not dissolve properly when you lay down and can cause pain in your throat. Drink plenty of fluids while taking this medicine to also help reduce irritation in your throat. This medicine can make you more sensitive to the sun. Keep out of the sun. If you cannot avoid being in the sun, wear protective clothing and use sunscreen. Do not use sun lamps or tanning beds/booths. Birth control pills may not work properly while you are taking this medicine. Talk to your doctor about using an extra method of birth control. If you are being treated for a sexually transmitted infection, avoid sexual contact until you have finished your treatment. Your sexual partner may also need treatment. Avoid antacids, aluminum, calcium, magnesium, and iron products for 4 hours before and 2 hours after taking a dose of this medicine. If you are using this medicine to prevent malaria, you should still protect yourself from contact  with mosquitos. Stay in screened-in areas, use mosquito nets, keep your body covered, and use an insect repellent. What side effects may I notice from receiving this medicine? Side effects that you should report to your doctor or health care professional  as soon as possible: -allergic reactions like skin rash, itching or hives, swelling of the face, lips, or tongue -difficulty breathing -fever -itching in the rectal or genital area -pain on swallowing -redness, blistering, peeling or loosening of the skin, including inside the mouth -severe stomach pain or cramps -unusual bleeding or bruising -unusually weak or tired -yellowing of the eyes or skin Side effects that usually do not require medical attention (report to your doctor or health care professional if they continue or are bothersome): -diarrhea -loss of appetite -nausea, vomiting This list may not describe all possible side effects. Call your doctor for medical advice about side effects. You may report side effects to FDA at 1-800-FDA-1088. Where should I keep my medicine? Keep out of the reach of children. Store at room temperature, below 30 degrees C (86 degrees F). Protect from light. Keep container tightly closed. Throw away any unused medicine after the expiration date. Taking this medicine after the expiration date can make you seriously ill. NOTE: This sheet is a summary. It may not cover all possible information. If you have questions about this medicine, talk to your doctor, pharmacist, or health care provider.    2016, Elsevier/Gold Standard. (2015-03-13 12:10:28) Folliculitis Folliculitis is redness, soreness, and swelling (inflammation) of the hair follicles. This condition can occur anywhere on the body. People with weakened immune systems, diabetes, or obesity have a greater risk of getting folliculitis. CAUSES  Bacterial infection. This is the most common cause.  Fungal infection.  Viral infection.  Contact with certain chemicals, especially oils and tars. Long-term folliculitis can result from bacteria that live in the nostrils. The bacteria may trigger multiple outbreaks of folliculitis over time. SYMPTOMS Folliculitis most commonly occurs on the scalp,  thighs, legs, back, buttocks, and areas where hair is shaved frequently. An early sign of folliculitis is a small, white or yellow, pus-filled, itchy lesion (pustule). These lesions appear on a red, inflamed follicle. They are usually less than 0.2 inches (5 mm) wide. When there is an infection of the follicle that goes deeper, it becomes a boil or furuncle. A group of closely packed boils creates a larger lesion (carbuncle). Carbuncles tend to occur in hairy, sweaty areas of the body. DIAGNOSIS  Your caregiver can usually tell what is wrong by doing a physical exam. A sample may be taken from one of the lesions and tested in a lab. This can help determine what is causing your folliculitis. TREATMENT  Treatment may include:  Applying warm compresses to the affected areas.  Taking antibiotic medicines orally or applying them to the skin.  Draining the lesions if they contain a large amount of pus or fluid.  Laser hair removal for cases of long-lasting folliculitis. This helps to prevent regrowth of the hair. HOME CARE INSTRUCTIONS  Apply warm compresses to the affected areas as directed by your caregiver.  If antibiotics are prescribed, take them as directed. Finish them even if you start to feel better.  You may take over-the-counter medicines to relieve itching.  Do not shave irritated skin.  Follow up with your caregiver as directed. SEEK IMMEDIATE MEDICAL CARE IF:   You have increasing redness, swelling, or pain in the affected area.  You have a fever. MAKE SURE YOU:  Understand these instructions.  Will watch your condition.  Will get help right away if you are not doing well or get worse.   This information is not intended to replace advice given to you by your health care provider. Make sure you discuss any questions you have with your health care provider.   Document Released: 01/30/2002 Document Revised: 12/12/2014 Document Reviewed: 02/21/2012 Elsevier Interactive  Patient Education [employer]

## 2017-04-19 ENCOUNTER — Encounter: Payer: Self-pay | Admitting: Gynecology

## 2017-04-27 ENCOUNTER — Encounter: Payer: Managed Care, Other (non HMO) | Admitting: Gynecology

## 2017-05-05 ENCOUNTER — Ambulatory Visit (INDEPENDENT_AMBULATORY_CARE_PROVIDER_SITE_OTHER): Payer: BLUE CROSS/BLUE SHIELD | Admitting: Gynecology

## 2017-05-05 ENCOUNTER — Encounter: Payer: Self-pay | Admitting: Gynecology

## 2017-05-05 VITALS — BP 118/80 | Ht 62.0 in | Wt 129.0 lb

## 2017-05-05 DIAGNOSIS — N898 Other specified noninflammatory disorders of vagina: Secondary | ICD-10-CM | POA: Diagnosis not present

## 2017-05-05 DIAGNOSIS — Z113 Encounter for screening for infections with a predominantly sexual mode of transmission: Secondary | ICD-10-CM | POA: Diagnosis not present

## 2017-05-05 DIAGNOSIS — Z01419 Encounter for gynecological examination (general) (routine) without abnormal findings: Secondary | ICD-10-CM | POA: Diagnosis not present

## 2017-05-05 DIAGNOSIS — F17200 Nicotine dependence, unspecified, uncomplicated: Secondary | ICD-10-CM | POA: Diagnosis not present

## 2017-05-05 DIAGNOSIS — A599 Trichomoniasis, unspecified: Secondary | ICD-10-CM | POA: Diagnosis not present

## 2017-05-05 HISTORY — DX: Trichomoniasis, unspecified: A59.9

## 2017-05-05 LAB — WET PREP FOR TRICH, YEAST, CLUE: YEAST WET PREP: NONE SEEN

## 2017-05-05 MED ORDER — METRONIDAZOLE 500 MG PO TABS: 500.0000 mg | ORAL_TABLET | Freq: Two times a day (BID) | ORAL | 0 refills | Status: DC

## 2017-05-05 NOTE — Progress Notes (Signed)
Bailey Stevenson 18-Feb-1971 [id number]   History:    46 y.o.  for annual gyn exam with a complaint of a vaginal discharge for several days. Patient states she is in a monogamous relationship. Review of patient's record indicated she had a Mirena IUD placed 5 years ago and needs to be changed this year. Also she had history of right ovarian cyst back in June 2017 was given Depo-Provera 150 mg IM to see if it will resolve this cyst and she did not return for follow-up ultrasound otherwise she had been asymptomatic. Patient's primary physician's been treating her for type 2 diabetes and she is due to see in this year. She denied any past history of any abnormal Pap smears. Patient had a negative HIV screen 2016. Patient has not had a baseline mammogram yet. Patient has had past history of Chlamydia infection.  Past medical history,surgical history, family history and social history were all reviewed and documented in the EPIC chart.  Gynecologic History No LMP recorded. Patient is not currently having periods (Reason: IUD). Contraception: IUD Last Pap: 2017. Results were: normal Last mammogram: No previous study. Results were: normal  Obstetric History OB History  Gravida Para Term Preterm AB Living  1 1 1     1   SAB TAB Ectopic Multiple Live Births          1    # Outcome Date GA Lbr Len/2nd Weight Sex Delivery Anes PTL Lv  1 Term     M CS-Unspec  N LIV       ROS: A ROS was performed and pertinent positives and negatives are included in the history.  GENERAL: No fevers or chills. HEENT: No change in vision, no earache, sore throat or sinus congestion. NECK: No pain or stiffness. CARDIOVASCULAR: No chest pain or pressure. No palpitations. PULMONARY: No shortness of breath, cough or wheeze. GASTROINTESTINAL: No abdominal pain, nausea, vomiting or diarrhea, melena or bright red blood per rectum. GENITOURINARY: No urinary frequency, urgency, hesitancy or dysuria. MUSCULOSKELETAL: No joint  or muscle pain, no back pain, no recent trauma. DERMATOLOGIC: No rash, no itching, no lesions. ENDOCRINE: No polyuria, polydipsia, no heat or cold intolerance. No recent change in weight. HEMATOLOGICAL: No anemia or easy bruising or bleeding. NEUROLOGIC: No headache, seizures, numbness, tingling or weakness. PSYCHIATRIC: No depression, no loss of interest in normal activity or change in sleep pattern.     Exam: chaperone present  BP 118/80   Ht 5\' 2"  (1.575 m)   Wt 129 lb (58.5 kg)   BMI 23.59 kg/m   Body mass index is 23.59 kg/m.  General appearance : Well developed well nourished female. No acute distress HEENT: Eyes: no retinal hemorrhage or exudates,  Neck supple, trachea midline, no carotid bruits, no thyroidmegaly Lungs: Clear to auscultation, no rhonchi or wheezes, or rib retractions  Heart: Regular rate and rhythm, no murmurs or gallops Breast:Examined in sitting and supine position were symmetrical in appearance, no palpable masses or tenderness,  no skin retraction, no nipple inversion, no nipple discharge, no skin discoloration, no axillary or supraclavicular lymphadenopathy Abdomen: no palpable masses or tenderness, no rebound or guarding Extremities: no edema or skin discoloration or tenderness  Pelvic:  Bartholin, Urethra, Skene Glands: Within normal limits             Vagina: Erythematous vagina with foul odor  Cervix: No gross lesions or discharge  Uterus  anteverted, normal size, shape and consistency, non-tender and mobile  Adnexa  Without masses or tenderness  Anus and perineum  normal   Rectovaginal  normal sphincter tone without palpated masses or tenderness             Hemoccult not indicated   Wet prep few Trichomonas noted few clue cells few white blood cells and many bacteria  Assessment/Plan:  46 y.o. female for annual exam with clinical evidence of trichomoniasis will be treated with Flagyl 500 mg one by mouth twice a day for 7 days. A GC and Chlamydia  culture was obtained. The complete a full STD workup she will stop by the lab we will check an HIV, RPR, hepatitis B and C. She will return back to the office in 2 weeks for test of cure. Patient to notify her partner so that he can be checked and treated accordingly has well. Patient was provided with requisition to schedule her mammogram since she has not had one yet. She will return back then in July to change her IUD once infections have been cleared.   Ok Edwards MD, 4:51 PM 05/05/2017

## 2017-05-05 NOTE — Patient Instructions (Addendum)
Steps to Quit Smoking Smoking tobacco can be harmful to your health and can affect almost every organ in your body. Smoking puts you, and those around you, at risk for developing many serious chronic diseases. Quitting smoking is difficult, but it is one of the best things that you can do for your health. It is never too late to quit. What are the benefits of quitting smoking? When you quit smoking, you lower your risk of developing serious diseases and conditions, such as:  Lung cancer or lung disease, such as COPD.  Heart disease.  Stroke.  Heart attack.  Infertility.  Osteoporosis and bone fractures.  Additionally, symptoms such as coughing, wheezing, and shortness of breath may get better when you quit. You may also find that you get sick less often because your body is stronger at fighting off colds and infections. If you are pregnant, quitting smoking can help to reduce your chances of having a baby of low birth weight. How do I get ready to quit? When you decide to quit smoking, create a plan to make sure that you are successful. Before you quit:  Pick a date to quit. Set a date within the next two weeks to give you time to prepare.  Write down the reasons why you are quitting. Keep this list in places where you will see it often, such as on your bathroom mirror or in your car or wallet.  Identify the people, places, things, and activities that make you want to smoke (triggers) and avoid them. Make sure to take these actions: ? Throw away all cigarettes at home, at work, and in your car. ? Throw away smoking accessories, such as ashtrays and lighters. ? Clean your car and make sure to empty the ashtray. ? Clean your home, including curtains and carpets.  Tell your family, friends, and coworkers that you are quitting. Support from your loved ones can make quitting easier.  Talk with your health care provider about your options for quitting smoking.  Find out what treatment  options are covered by your health insurance.  What strategies can I use to quit smoking? Talk with your healthcare provider about different strategies to quit smoking. Some strategies include:  Quitting smoking altogether instead of gradually lessening how much you smoke over a period of time. Research shows that quitting "cold turkey" is more successful than gradually quitting.  Attending in-person counseling to help you build problem-solving skills. You are more likely to have success in quitting if you attend several counseling sessions. Even short sessions of 10 minutes can be effective.  Finding resources and support systems that can help you to quit smoking and remain smoke-free after you quit. These resources are most helpful when you use them often. They can include: ? Online chats with a counselor. ? Telephone quitlines. ? Printed self-help materials. ? Support groups or group counseling. ? Text messaging programs. ? Mobile phone applications.  Taking medicines to help you quit smoking. (If you are pregnant or breastfeeding, talk with your health care provider first.) Some medicines contain nicotine and some do not. Both types of medicines help with cravings, but the medicines that include nicotine help to relieve withdrawal symptoms. Your health care provider may recommend: ? Nicotine patches, gum, or lozenges. ? Nicotine inhalers or sprays. ? Non-nicotine medicine that is taken by mouth.  Talk with your health care provider about combining strategies, such as taking medicines while you are also receiving in-person counseling. Using these two strategies together   makes you more likely to succeed in quitting than if you used either strategy on its own. If you are pregnant or breastfeeding, talk with your health care provider about finding counseling or other support strategies to quit smoking. Do not take medicine to help you quit smoking unless told to do so by your health care  provider. What things can I do to make it easier to quit? Quitting smoking might feel overwhelming at first, but there is a lot that you can do to make it easier. Take these important actions:  Reach out to your family and friends and ask that they support and encourage you during this time. Call telephone quitlines, reach out to support groups, or work with a counselor for support.  Ask people who smoke to avoid smoking around you.  Avoid places that trigger you to smoke, such as bars, parties, or smoke-break areas at work.  Spend time around people who do not smoke.  Lessen stress in your life, because stress can be a smoking trigger for some people. To lessen stress, try: ? Exercising regularly. ? Deep-breathing exercises. ? Yoga. ? Meditating. ? Performing a body scan. This involves closing your eyes, scanning your body from head to toe, and noticing which parts of your body are particularly tense. Purposefully relax the muscles in those areas.  Download or purchase mobile phone or tablet apps (applications) that can help you stick to your quit plan by providing reminders, tips, and encouragement. There are many free apps, such as QuitGuide from the [employer] developer for Disease Control and Prevention). You can find other support for quitting smoking (smoking cessation) through smokefree.gov and other websites.  How will I feel when I quit smoking? Within the first 24 hours of quitting smoking, you may start to feel some withdrawal symptoms. These symptoms are usually most noticeable 2-3 days after quitting, but they usually do not last beyond 2-3 weeks. Changes or symptoms that you might experience include:  Mood swings.  Restlessness, anxiety, or irritation.  Difficulty concentrating.  Dizziness.  Strong cravings for sugary foods in addition to nicotine.  Mild weight gain.  Constipation.  Nausea.  Coughing or a sore throat.  Changes in how your medicines work in your  body.  A depressed mood.  Difficulty sleeping (insomnia).  After the first 2-3 weeks of quitting, you may start to notice more positive results, such as:  Improved sense of smell and taste.  Decreased coughing and sore throat.  Slower heart rate.  Lower blood pressure.  Clearer skin.  The ability to breathe more easily.  Fewer sick days.  Quitting smoking is very challenging for most people. Do not get discouraged if you are not successful the first time. Some people need to make many attempts to quit before they achieve long-term success. Do your best to stick to your quit plan, and talk with your health care provider if you have any questions or concerns. This information is not intended to replace advice given to you by your health care provider. Make sure you discuss any questions you have with your health care provider. Document Released: 11/15/2001 Document Revised: 07/19/2016 Document Reviewed: 04/07/2015 Elsevier Interactive Patient Education  2017 Elsevier Inc.   Metronidazole tablets or capsules What is this medicine? METRONIDAZOLE (me troe NI da zole) is an antiinfective. It is used to treat certain kinds of bacterial and protozoal infections. It will not work for colds, flu, or other viral infections. This medicine may be used for other purposes;  ask your health care provider or pharmacist if you have questions. COMMON BRAND NAME(S): Flagyl What should I tell my health care provider before I take this medicine? They need to know if you have any of these conditions: -anemia or other blood disorders -disease of the nervous system -fungal or yeast infection -if you drink alcohol containing drinks -liver disease -seizures -an unusual or allergic reaction to metronidazole, or other medicines, foods, dyes, or preservatives -pregnant or trying to get pregnant -breast-feeding How should I use this medicine? Take this medicine by mouth with a full glass of water.  Follow the directions on the prescription label. Take your medicine at regular intervals. Do not take your medicine more often than directed. Take all of your medicine as directed even if you think you are better. Do not skip doses or stop your medicine early. Talk to your pediatrician regarding the use of this medicine in children. Special care may be needed. Overdosage: If you think you have taken too much of this medicine contact a poison control center or emergency room at once. NOTE: This medicine is only for you. Do not share this medicine with others. What if I miss a dose? If you miss a dose, take it as soon as you can. If it is almost time for your next dose, take only that dose. Do not take double or extra doses. What may interact with this medicine? Do not take this medicine with any of the following medications: -alcohol or any product that contains alcohol -amprenavir oral solution -cisapride -disulfiram -dofetilide -dronedarone -paclitaxel injection -pimozide -ritonavir oral solution -sertraline oral solution -sulfamethoxazole-trimethoprim injection -thioridazine -ziprasidone This medicine may also interact with the following medications: -birth control pills -cimetidine -lithium -other medicines that prolong the QT interval (cause an abnormal heart rhythm) -phenobarbital -phenytoin -warfarin This list may not describe all possible interactions. Give your health care provider a list of all the medicines, herbs, non-prescription drugs, or dietary supplements you use. Also tell them if you smoke, drink alcohol, or use illegal drugs. Some items may interact with your medicine. What should I watch for while using this medicine? Tell your doctor or health care professional if your symptoms do not improve or if they get worse. You may get drowsy or dizzy. Do not drive, use machinery, or do anything that needs mental alertness until you know how this medicine affects you. Do  not stand or sit up quickly, especially if you are an older patient. This reduces the risk of dizzy or fainting spells. Avoid alcoholic drinks while you are taking this medicine and for three days afterward. Alcohol may make you feel dizzy, sick, or flushed. If you are being treated for a sexually transmitted disease, avoid sexual contact until you have finished your treatment. Your sexual partner may also need treatment. What side effects may I notice from receiving this medicine? Side effects that you should report to your doctor or health care professional as soon as possible: -allergic reactions like skin rash or hives, swelling of the face, lips, or tongue -confusion, clumsiness -difficulty speaking -discolored or sore mouth -dizziness -fever, infection -numbness, tingling, pain or weakness in the hands or feet -trouble passing urine or change in the amount of urine -redness, blistering, peeling or loosening of the skin, including inside the mouth -seizures -unusually weak or tired -vaginal irritation, dryness, or discharge Side effects that usually do not require medical attention (report to your doctor or health care professional if they continue or are bothersome): -diarrhea -  headache -irritability -metallic taste -nausea -stomach pain or cramps -trouble sleeping This list may not describe all possible side effects. Call your doctor for medical advice about side effects. You may report side effects to FDA at 1-800-FDA-1088. Where should I keep my medicine? Keep out of the reach of children. Store at room temperature below 25 degrees C (77 degrees F). Protect from light. Keep container tightly closed. Throw away any unused medicine after the expiration date. NOTE: This sheet is a summary. It may not cover all possible information. If you have questions about this medicine, talk to your doctor, pharmacist, or health care provider.  2018 Elsevier/Gold Standard (2013-06-28  14:08:39)  Trichomoniasis Trichomoniasis is an STI (sexually transmitted infection) that can affect both women and men. In women, the outer area of the female genitalia (vulva) and the vagina are affected. In men, the penis is mainly affected, but the prostate and other reproductive organs can also be involved. This condition can be treated with medicine. It often has no symptoms (is asymptomatic), especially in men. What are the causes? This condition is caused by an organism called Trichomonas vaginalis. Trichomoniasis most often spreads from person to person (is contagious) through sexual contact. What increases the risk? The following factors may make you more likely to develop this condition:  Having unprotected sexual intercourse.  Having sexual intercourse with a partner who has trichomoniasis.  Having multiple sexual partners.  Having had previous trichomoniasis infections or other STIs.  What are the signs or symptoms? In women, symptoms of trichomoniasis include:  Abnormal vaginal discharge that is clear, white, gray, or yellow-green and foamy and has an unusual "fishy" odor.  Itching and irritation of the vagina and vulva.  Burning or pain during urination or sexual intercourse.  Genital redness and swelling.  In men, symptoms of trichomoniasis include:  Penile discharge that may be foamy or contain pus.  Pain in the penis. This may happen only when urinating.  Itching or irritation inside the penis.  Burning after urination or ejaculation.  How is this diagnosed? In women, this condition may be found during a routine Pap test or physical exam. It may be found in men during a routine physical exam. Your health care provider may perform tests to help diagnose this infection, such as:  Urine tests (men and women).  The following in women: ? Testing the pH of the vagina. ? A vaginal swab test that checks for the Trichomonas vaginalis organism. ? Testing vaginal  secretions.  Your health care provider may test you for other STIs, including HIV (human immunodeficiency virus). How is this treated? This condition is treated with medicine taken by mouth (orally), such as metronidazole or tinidazole to fight the infection. Your sexual partner(s) may also need to be tested and treated.  If you are a woman and you plan to become pregnant or think you may be pregnant, tell your health care provider right away. Some medicines that are used to treat the infection should not be taken during pregnancy.  Your health care provider may recommend over-the-counter medicines or creams to help relieve itching or irritation. You may be tested for infection again 3 months after treatment. Follow these instructions at home:  Take and use over-the-counter and prescription medicines, including creams, only as told by your health care provider.  Do not have sexual intercourse until one week after you finish your medicine, or until your health care provider approves. Ask your health care provider when you may resume sexual  intercourse.  (Women) Do not douche or wear tampons while you have the infection.  Discuss your infection with your sexual partner(s). Make sure that your partner gets tested and treated, if necessary.  Keep all follow-up visits as told by your health care provider. This is important. How is this prevented?  Use condoms every time you have sex. Using condoms correctly and consistently can help protect against STIs.  Avoid having multiple sexual partners.  Talk with your sexual partner about any symptoms that either of you may have, as well as any history of STIs.  Get tested for STIs and STDs (sexually transmitted diseases) before you have sex. Ask your partner to do the same.  Do not have sexual contact if you have symptoms of trichomoniasis or another STI. Contact a health care provider if:  You still have symptoms after you finish your  medicine.  You develop pain in your abdomen.  You have pain when you urinate.  You have bleeding after sexual intercourse.  You develop a rash.  You feel nauseous or you vomit.  You plan to become pregnant or think you may be pregnant. Summary  Trichomoniasis is an STI (sexually transmitted infection) that can affect both women and men.  This condition often has no symptoms (is asymptomatic), especially in men.  You should not have sexual intercourse until one week after you finish your medicine, or until your health care provider approves. Ask your health care provider when you may resume sexual intercourse.  Discuss your infection with your sexual partner. Make sure that your partner gets tested and treated, if necessary. This information is not intended to replace advice given to you by your health care provider. Make sure you discuss any questions you have with your health care provider. Document Released: 05/17/2001 Document Revised: 10/14/2016 Document Reviewed: 10/14/2016 Elsevier Interactive Patient Education  2017 [employer]

## 2017-05-06 LAB — RPR

## 2017-05-06 LAB — HIV ANTIBODY (ROUTINE TESTING W REFLEX): HIV 1&2 Ab, 4th Generation: NONREACTIVE

## 2017-05-06 LAB — GC/CHLAMYDIA PROBE AMP
CT Probe RNA: NOT DETECTED
GC Probe RNA: NOT DETECTED

## 2017-05-06 LAB — HEPATITIS C ANTIBODY: HCV AB: NEGATIVE

## 2017-05-06 LAB — HEPATITIS B SURFACE ANTIGEN: HEP B S AG: NEGATIVE

## 2017-05-19 ENCOUNTER — Ambulatory Visit (INDEPENDENT_AMBULATORY_CARE_PROVIDER_SITE_OTHER): Payer: BLUE CROSS/BLUE SHIELD | Admitting: Gynecology

## 2017-05-19 ENCOUNTER — Encounter: Payer: Self-pay | Admitting: Gynecology

## 2017-05-19 VITALS — BP 142/80

## 2017-05-19 DIAGNOSIS — B373 Candidiasis of vulva and vagina: Secondary | ICD-10-CM

## 2017-05-19 DIAGNOSIS — Z8619 Personal history of other infectious and parasitic diseases: Secondary | ICD-10-CM | POA: Diagnosis not present

## 2017-05-19 DIAGNOSIS — N898 Other specified noninflammatory disorders of vagina: Secondary | ICD-10-CM

## 2017-05-19 DIAGNOSIS — N9089 Other specified noninflammatory disorders of vulva and perineum: Secondary | ICD-10-CM

## 2017-05-19 DIAGNOSIS — B3731 Acute candidiasis of vulva and vagina: Secondary | ICD-10-CM

## 2017-05-19 LAB — WET PREP FOR TRICH, YEAST, CLUE
Clue Cells Wet Prep HPF POC: NONE SEEN
Trich, Wet Prep: NONE SEEN

## 2017-05-19 MED ORDER — FLUCONAZOLE 150 MG PO TABS: ORAL_TABLET | ORAL | 1 refills | Status: DC

## 2017-05-19 NOTE — Addendum Note (Signed)
Addended by: Kem ParkinsonBARNES, Marigrace Mccole on: 05/19/2017 11:41 AM   Modules accepted: Orders

## 2017-05-19 NOTE — Progress Notes (Signed)
   Patient is a 46 year old who was seen in as her annual exam on June 1 of this year. She was complaining of a vaginal discharge and a wet prep demonstrated trichomoniasis and she was treated with Flagyl 500 mg twice a day for 7 days. She had a complete STD screening consisting of GC and Chlamydia culture along with HIV, RPR, hepatitis B and C which all were negative. She had a normal Pap smear in 2017. She had a Mirena IUD that is due to be removed this year. She would like to replace it later this year. She is here for test of cure. She stated that her partner was evaluated and they did not find any infection.  At time of repeating a wet prep today it was noted that at the area the fourchette there was a leukoplakic raised area. After the wet prep was obtained patient was counseled for a key punch biopsy of the area. The air was cleansed with Betadine solution and 1% lidocaine was infiltrated at the base for approximately 3 cc and a key punch biopsy of the area was obtained and tissue was submitted for histological evaluation. Silver nitrate and Monsel solution was used for hemostasis.  Assessment/plan: Patient an annual exam recently complaining of vaginal discharge was found to have trichomoniasis was treated negative wet prep today. Complete STD screening June 1 negative. 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 We'll wait for results next week and contact patient and monitor accordingly. She will be due to remove her Mirena IUD later this year.

## 2017-05-19 NOTE — Patient Instructions (Signed)
Fluconazole tablets What is this medicine? FLUCONAZOLE (floo KON na zole) is an antifungal medicine. It is used to treat certain kinds of fungal or yeast infections. This medicine may be used for other purposes; ask your health care provider or pharmacist if you have questions. COMMON BRAND NAME(S): Diflucan What should I tell my health care provider before I take this medicine? They need to know if you have any of these conditions: -history of irregular heart beat -kidney disease -an unusual or allergic reaction to fluconazole, other azole antifungals, medicines, foods, dyes, or preservatives -pregnant or trying to get pregnant -breast-feeding How should I use this medicine? Take this medicine by mouth. Follow the directions on the prescription label. Do not take your medicine more often than directed. Talk to your pediatrician regarding the use of this medicine in children. Special care may be needed. This medicine has been used in children as young as 6 months of age. Overdosage: If you think you have taken too much of this medicine contact a poison control center or emergency room at once. NOTE: This medicine is only for you. Do not share this medicine with others. What if I miss a dose? If you miss a dose, take it as soon as you can. If it is almost time for your next dose, take only that dose. Do not take double or extra doses. What may interact with this medicine? Do not take this medicine with any of the following medications: -astemizole -certain medicines for irregular heart beat like dofetilide, dronedarone, quinidine -cisapride -erythromycin -lomitapide -other medicines that prolong the QT interval (cause an abnormal heart rhythm) -pimozide -terfenadine -thioridazine -tolvaptan -ziprasidone This medicine may also interact with the following medications: -antiviral medicines for HIV or AIDS -birth control pills -certain antibiotics like rifabutin, rifampin -certain  medicines for blood pressure like amlodipine, isradipine, felodipine, hydrochlorothiazide, losartan, nifedipine -certain medicines for cancer like cyclophosphamide, vinblastine, vincristine -certain medicines for cholesterol like atorvastatin, lovastatin, fluvastatin, simvastatin -certain medicines for depression, anxiety, or psychotic disturbances like amitriptyline, midazolam, nortriptyline, triazolam -certain medicines for diabetes like glipizide, glyburide, tolbutamide -certain medicines for pain like alfentanil, fentanyl, methadone -certain medicines for seizures like carbamazepine, phenytoin -certain medicines that treat or prevent blood clots like warfarin -halofantrine -medicines that lower your chance of fighting infection like cyclosporine, prednisone, tacrolimus -NSAIDS, medicines for pain and inflammation, like celecoxib, diclofenac, flurbiprofen, ibuprofen, meloxicam, naproxen -other medicines for fungal infections -sirolimus -theophylline -tofacitinib This list may not describe all possible interactions. Give your health care provider a list of all the medicines, herbs, non-prescription drugs, or dietary supplements you use. Also tell them if you smoke, drink alcohol, or use illegal drugs. Some items may interact with your medicine. What should I watch for while using this medicine? Visit your doctor or health care professional for regular checkups. If you are taking this medicine for a long time you may need blood work. Tell your doctor if your symptoms do not improve. Some fungal infections need many weeks or months of treatment to cure. Alcohol can increase possible damage to your liver. Avoid alcoholic drinks. If you have a vaginal infection, do not have sex until you have finished your treatment. You can wear a sanitary napkin. Do not use tampons. Wear freshly washed cotton, not synthetic, panties. What side effects may I notice from receiving this medicine? Side effects that  you should report to your doctor or health care professional as soon as possible: -allergic reactions like skin rash or itching, hives, swelling of the   lips, mouth, tongue, or throat -dark urine -feeling dizzy or faint -irregular heartbeat or chest pain -redness, blistering, peeling or loosening of the skin, including inside the mouth -trouble breathing -unusual bruising or bleeding -vomiting -yellowing of the eyes or skin Side effects that usually do not require medical attention (report to your doctor or health care professional if they continue or are bothersome): -changes in how food tastes -diarrhea -headache -stomach upset or nausea This list may not describe all possible side effects. Call your doctor for medical advice about side effects. You may report side effects to FDA at 1-800-FDA-1088. Where should I keep my medicine? Keep out of the reach of children. Store at room temperature below 30 degrees C (86 degrees F). Throw away any medicine after the expiration date. NOTE: This sheet is a summary. It may not cover all possible information. If you have questions about this medicine, talk to your doctor, pharmacist, or health care provider.  2018 Elsevier/Gold Standard (2013-06-29 19:37:38) Vaginal Yeast infection, Adult Vaginal yeast infection is a condition that causes soreness, swelling, and redness (inflammation) of the vagina. It also causes vaginal discharge. This is a common condition. Some women get this infection frequently. What are the causes? This condition is caused by a change in the normal balance of the yeast (candida) and bacteria that live in the vagina. This change causes an overgrowth of yeast, which causes the inflammation. What increases the risk? This condition is more likely to develop in:  Women who take antibiotic medicines.  Women who have diabetes.  Women who take birth control pills.  Women who are pregnant.  Women who douche often.  Women  who have a weak defense (immune) system.  Women who have been taking steroid medicines for a long time.  Women who frequently wear tight clothing.  What are the signs or symptoms? Symptoms of this condition include:  White, thick vaginal discharge.  Swelling, itching, redness, and irritation of the vagina. The lips of the vagina (vulva) may be affected as well.  Pain or a burning feeling while urinating.  Pain during sex.  How is this diagnosed? This condition is diagnosed with a medical history and physical exam. This will include a pelvic exam. Your health care provider will examine a sample of your vaginal discharge under a microscope. Your health care provider may send this sample for testing to confirm the diagnosis. How is this treated? This condition is treated with medicine. Medicines may be over-the-counter or prescription. You may be told to use one or more of the following:  Medicine that is taken orally.  Medicine that is applied as a cream.  Medicine that is inserted directly into the vagina (suppository).  Follow these instructions at home:  Take or apply over-the-counter and prescription medicines only as told by your health care provider.  Do not have sex until your health care provider has approved. Tell your sex partner that you have a yeast infection. That person should go to his or her health care provider if he or she develops symptoms.  Do not wear tight clothes, such as pantyhose or tight pants.  Avoid using tampons until your health care provider approves.  Eat more yogurt. This may help to keep your yeast infection from returning.  Try taking a sitz bath to help with discomfort. This is a warm water bath that is taken while you are sitting down. The water should only come up to your hips and should cover your buttocks. Do this  3-4 times per day or as told by your health care provider.  Do not douche.  Wear breathable, cotton underwear.  If you  have diabetes, keep your blood sugar levels under control. Contact a health care provider if:  You have a fever.  Your symptoms go away and then return.  Your symptoms do not get better with treatment.  Your symptoms get worse.  You have new symptoms.  You develop blisters in or around your vagina.  You have blood coming from your vagina and it is not your menstrual period.  You develop pain in your abdomen. This information is not intended to replace advice given to you by your health care provider. Make sure you discuss any questions you have with your health care provider. Document Released: 08/31/2005 Document Revised: 05/04/2016 Document Reviewed: 05/25/2015 Elsevier Interactive Patient Education  2018 [employer]

## 2017-09-29 ENCOUNTER — Encounter: Payer: BLUE CROSS/BLUE SHIELD | Admitting: Medical

## 2017-10-13 ENCOUNTER — Ambulatory Visit (INDEPENDENT_AMBULATORY_CARE_PROVIDER_SITE_OTHER): Payer: BLUE CROSS/BLUE SHIELD | Admitting: Medical

## 2017-10-13 ENCOUNTER — Encounter: Payer: Self-pay | Admitting: Medical

## 2017-10-13 VITALS — BP 140/92 | HR 101 | Ht 63.0 in | Wt 126.2 lb

## 2017-10-13 DIAGNOSIS — E1165 Type 2 diabetes mellitus with hyperglycemia: Secondary | ICD-10-CM

## 2017-10-13 DIAGNOSIS — G8929 Other chronic pain: Secondary | ICD-10-CM | POA: Diagnosis not present

## 2017-10-13 DIAGNOSIS — I10 Essential (primary) hypertension: Secondary | ICD-10-CM | POA: Diagnosis not present

## 2017-10-13 DIAGNOSIS — M25511 Pain in right shoulder: Secondary | ICD-10-CM

## 2017-10-13 DIAGNOSIS — Z7189 Other specified counseling: Secondary | ICD-10-CM

## 2017-10-13 DIAGNOSIS — Z7185 Encounter for immunization safety counseling: Secondary | ICD-10-CM | POA: Insufficient documentation

## 2017-10-13 DIAGNOSIS — R000 Tachycardia, unspecified: Secondary | ICD-10-CM | POA: Diagnosis not present

## 2017-10-13 DIAGNOSIS — Z0000 Encounter for general adult medical examination without abnormal findings: Secondary | ICD-10-CM | POA: Diagnosis not present

## 2017-10-13 HISTORY — DX: Essential (primary) hypertension: I10

## 2017-10-13 LAB — POCT URINALYSIS DIP (PROADVANTAGE DEVICE)
BILIRUBIN UA: NEGATIVE mg/dL
Bilirubin, UA: NEGATIVE
Blood, UA: NEGATIVE
Glucose, UA: NEGATIVE mg/dL
Leukocytes, UA: NEGATIVE
Nitrite, UA: NEGATIVE
SPECIFIC GRAVITY, URINE: 1.03
UUROB: NEGATIVE
pH, UA: 6 (ref 5.0–8.0)

## 2017-10-13 MED ORDER — METOPROLOL TARTRATE 25 MG PO TABS: 25.0000 mg | ORAL_TABLET | Freq: Two times a day (BID) | ORAL | 2 refills | Status: DC
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
## 2017-10-13 NOTE — Patient Instructions (Signed)
Recommendations: Try to come in every 3 months for diabetes and med check follow up until things are more stable with your sugars and blood pressure  See your eye doctor yearly for routine vision care.  See your dentist yearly for routine dental care including hygiene visits twice yearly.  Call your insurance about vaccine coverage You are due for:  Tdap - tetanus diptheria and pertussis vaccine   Flu shot yearly  Pneumococcal 23 vaccine once   You have high blood pressure and fast heart rate.  I would like you to start a medication called Metoprolol to lower your heart rate and blood pressure  The last time we checked your sugars they were too high.  We will call Monday with lab results and recommendations   Medication costs:  If you get to the pharmacy and medication prescribed today was either too expensive, not covered by your insurance, or required prior authorization, then please call us back to let us know.  We often have no way to know if a medication is too expensive or not covered by your insurance.  Thanks for your cooperation.     [facility] Address: [address], [place], [place] [address] Phone: [phone] Sunday 9:30 and 11:00

## 2017-10-14 LAB — TSH: TSH: 1.1 m[IU]/L

## 2017-10-14 LAB — LIPID PANEL
CHOL/HDL RATIO: 3.2 (calc) (ref <5.0)
Cholesterol: 191 mg/dL (ref <200)
HDL: 60 mg/dL (ref >50)
LDL Cholesterol (Calc): 112 mg/dL (calc) — ABNORMAL HIGH
NON-HDL CHOLESTEROL (CALC): 131 mg/dL — AB (ref <130)
Triglycerides: 88 mg/dL (ref <150)

## 2017-10-14 LAB — CBC WITH DIFFERENTIAL/PLATELET
BASOS ABS: 11 {cells}/uL (ref 0–200)
Basophils Relative: 0.2 %
EOS PCT: 4.7 %
Eosinophils Absolute: 263 cells/uL (ref 15–500)
HCT: 45.5 % — ABNORMAL HIGH (ref 35.0–45.0)
HEMOGLOBIN: 15.8 g/dL — AB (ref 11.7–15.5)
Lymphs Abs: 1999 cells/uL (ref 850–3900)
MCH: 31.5 pg (ref 27.0–33.0)
MCHC: 34.7 g/dL (ref 32.0–36.0)
MCV: 90.8 fL (ref 80.0–100.0)
MONOS PCT: 7.2 %
MPV: 11 fL (ref 7.5–12.5)
NEUTROS ABS: 2923 {cells}/uL (ref 1500–7800)
NEUTROS PCT: 52.2 %
Platelets: 201 10*3/uL (ref 140–400)
RBC: 5.01 10*6/uL (ref 3.80–5.10)
RDW: 12.4 % (ref 11.0–15.0)
Total Lymphocyte: 35.7 %
WBC mixed population: 403 cells/uL (ref 200–950)
WBC: 5.6 10*3/uL (ref 3.8–10.8)

## 2017-10-14 LAB — COMPREHENSIVE METABOLIC PANEL
AG RATIO: 1.3 (calc) (ref 1.0–2.5)
ALKALINE PHOSPHATASE (APISO): 134 U/L — AB (ref 33–115)
ALT: 46 U/L — AB (ref 6–29)
AST: 22 U/L (ref 10–30)
Albumin: 3.9 g/dL (ref 3.6–5.1)
BUN: 10 mg/dL (ref 7–25)
CHLORIDE: 102 mmol/L (ref 98–110)
CO2: 25 mmol/L (ref 20–32)
CREATININE: 0.81 mg/dL (ref 0.50–1.10)
Calcium: 9.3 mg/dL (ref 8.6–10.2)
GLOBULIN: 2.9 g/dL (ref 1.9–3.7)
GLUCOSE: 180 mg/dL — AB (ref 65–99)
Potassium: 3.9 mmol/L (ref 3.5–5.3)
Sodium: 136 mmol/L (ref 135–146)
Total Bilirubin: 0.5 mg/dL (ref 0.2–1.2)
Total Protein: 6.8 g/dL (ref 6.1–8.1)

## 2017-10-14 LAB — HEMOGLOBIN A1C
EAG (MMOL/L): 14.4 (calc)
HEMOGLOBIN A1C: 10.7 %{Hb} — AB (ref <5.7)
Mean Plasma Glucose: 260 (calc)

## 2017-10-14 LAB — MICROALBUMIN / CREATININE URINE RATIO
Creatinine, Urine: 235 mg/dL (ref 20–275)
Microalb Creat Ratio: 21 mcg/mg creat (ref <30)
Microalb, Ur: 5 mg/dL

## 2017-10-17 ENCOUNTER — Other Ambulatory Visit: Payer: Self-pay | Admitting: Medical

## 2017-10-17 MED ORDER — LINAGLIPTIN-METFORMIN HCL 2.5-1000 MG PO TABS: 1.0000 | ORAL_TABLET | Freq: Two times a day (BID) | ORAL | 1 refills | Status: DC

## 2017-10-17 MED ORDER — PRAVASTATIN SODIUM 20 MG PO TABS: 20.0000 mg | ORAL_TABLET | Freq: Every evening | ORAL | 0 refills | Status: DC

## 2018-01-04 ENCOUNTER — Telehealth: Payer: Self-pay

## 2018-01-04 NOTE — Telephone Encounter (Signed)
Pt called to schedule labs per shane. No answer and lft a voice mail for her to call . Thanks [employer]

## 2018-07-26 ENCOUNTER — Encounter (HOSPITAL_COMMUNITY): Payer: Self-pay | Admitting: Emergency Medicine

## 2018-07-26 ENCOUNTER — Ambulatory Visit (HOSPITAL_COMMUNITY)
Admission: EM | Admit: 2018-07-26 | Discharge: 2018-07-26 | Disposition: A | Discharge status: Home / Self Care | Payer: Managed Care, Other (non HMO) | Attending: Family Medicine | Admitting: Family Medicine

## 2018-07-26 DIAGNOSIS — K047 Periapical abscess without sinus: Secondary | ICD-10-CM

## 2018-07-26 MED ORDER — AMOXICILLIN-POT CLAVULANATE 875-125 MG PO TABS: 1.0000 | ORAL_TABLET | Freq: Two times a day (BID) | ORAL | 0 refills | Status: DC

## 2018-07-26 MED ORDER — LIDOCAINE HCL (PF) 1 % IJ SOLN
INTRAMUSCULAR | Status: AC
  Filled 2018-07-26: qty 2

## 2018-07-26 MED ORDER — CEFTRIAXONE SODIUM 1 G IJ SOLR
1.0000 g | Freq: Once | INTRAMUSCULAR | Status: AC
Start: 2018-07-26 — End: 2018-07-26
  Administered 2018-07-26: 1 g via INTRAMUSCULAR

## 2018-07-26 MED ORDER — CEFTRIAXONE SODIUM 1 G IJ SOLR
INTRAMUSCULAR | Status: AC
  Filled 2018-07-26: qty 10

## 2018-07-26 NOTE — ED Triage Notes (Signed)
Pt states on Tuesday she started having some L upper tooth pain with facial swelling. Pt L cheek is swollen.

## 2018-07-26 NOTE — ED Notes (Signed)
Provided notes to patient and patient's husband.

## 2018-07-26 NOTE — Discharge Instructions (Addendum)
It was nice meeting you!!  We will go ahead and give you an injection for the infection here.  I am going to send you home with some antibiotics.  Keep taking the aleve for pain.  If you develop any worsening pain or increased swelling please go to the ER.

## 2018-07-27 NOTE — ED Provider Notes (Addendum)
[facility]    CSN: [id number] Arrival date & time: 07/26/18  1239     History   Chief Complaint Chief Complaint  Patient presents with  . Facial Swelling    HPI Bailey Stevenson is a 47 y.o. female.   Pt is a 47 year old female with PMH of asthma, DM2, HTN. She presents with 3 days of dental pain and facial swelling. It started with pain in the left upper mouth and then rapidly started swelling the next day. This problem has been constant and worsening.  The swelling is on the left side of her face and extending into her left eye. She is not having any issues with eye pain or vision changes. The area is tender to touch but she has been taking ibuprofen for the pain. This seems to help. She does not see a dentist regularly. She denies any fever, chill, body aches, fatigue.  She does smoke. Family and PMH reviewed.   ROS per HPI      Past Medical History:  Diagnosis Date  . Asthma   . Diabetes mellitus    TYPE II  . Essential hypertension, benign 10/13/2017  . Pneumonia   . Trichimoniasis 05/2017    Patient Active Problem List   Diagnosis Date Noted  . Routine general medical examination at a health care facility 10/13/2017  . Essential hypertension, benign 10/13/2017  . Vaccine counseling 10/13/2017  . Tachycardia 10/13/2017  . Ovarian cyst, right 01/06/2016  . Diabetes mellitus type 2, uncontrolled (HCC) 12/19/2011    Past Surgical History:  Procedure Laterality Date  . CESAREAN SECTION    . CHOLECYSTECTOMY      OB History    Gravida  1   Para  1   Term  1   Preterm      AB      Living  1     SAB      TAB      Ectopic      Multiple      Live Births  1            Home Medications    Prior to Admission medications   Medication Sig Start Date End Date Taking? Authorizing Provider  amoxicillin-clavulanate (AUGMENTIN) 875-125 MG tablet Take 1 tablet by mouth every 12 (twelve) hours. 07/26/18   Dahlia ByesBast, Delquan Poucher A, NP    Linagliptin-Metformin HCl 2.04-999 MG TABS Take 1 tablet 2 (two) times daily by mouth. 10/17/17   Tysinger, Kermit Baloavid S, PA-C  metoprolol tartrate (LOPRESSOR) 25 MG tablet Take 1 tablet (25 mg total) 2 (two) times daily by mouth. 1 tablet po BID for blood pressure 10/13/17   Tysinger, Kermit Baloavid S, PA-C  pravastatin (PRAVACHOL) 20 MG tablet Take 1 tablet (20 mg total) every evening by mouth. 10/17/17 10/17/18  Tysinger, Kermit Baloavid S, PA-C    Family History Family History  Problem Relation Age of Onset  . Diabetes Maternal Grandmother   . Breast cancer Maternal Grandmother   . Hypertension Mother   . Diabetes Mother   . Kidney failure Mother   . Hypertension Father   . Diabetes Father   . Stroke Father   . Hypertension Maternal Aunt   . Diabetes Maternal Aunt   . Cancer Maternal Aunt        throat  . Hypertension Maternal Uncle   . Diabetes Maternal Uncle   . Hypertension Paternal Aunt   . Diabetes Paternal Aunt   . Hypertension Paternal Uncle   .  Diabetes Paternal Uncle     Social History Social History   Tobacco Use  . Smoking status: Current Every Day Smoker    Packs/day: 0.25    Years: 9.00    Pack years: 2.25    Types: Cigarettes  . Smokeless tobacco: Never Used  Substance Use Topics  . Alcohol use: Yes  . Drug use: No     Allergies   Shellfish allergy and Eggs or egg-derived products   Review of Systems Review of Systems   Physical Exam Triage Vital Signs ED Triage Vitals [07/26/18 1318]  Enc Vitals Group     BP 137/88     Pulse Rate 97     Resp 18     Temp 97.9 F (36.6 C)     Temp src      SpO2 100 %     Weight      Height      Head Circumference      Peak Flow      Pain Score      Pain Loc      Pain Edu?      Excl. in GC?    No data found.  Updated Vital Signs BP 137/88   Pulse 97   Temp 97.9 F (36.6 C)   Resp 18   SpO2 100%   Visual Acuity Right Eye Distance:   Left Eye Distance:   Bilateral Distance:    Right Eye Near:   Left Eye  Near:    Bilateral Near:     Physical Exam  Constitutional: She appears well-developed and well-nourished. No distress.  Very pleasant.  Nontoxic or ill-appearing.  HENT:  Head: Normocephalic and atraumatic.  Significant swelling to left facial area extending into the left eye. She is able to open the eye. No swelling in neck area. No trismus. Tender to touch.   Multiple dental caries throughout mouth. Swelling to left upper gums and buccal mucosa. Able to move tonge back and fourth.   Eyes: Pupils are equal, round, and reactive to light. EOM are normal.  Neck: Normal range of motion.  Pulmonary/Chest: Effort normal.  Musculoskeletal: Normal range of motion.  Lymphadenopathy:    She has no cervical adenopathy.  Neurological: She is alert.  Skin: Skin is warm and dry.  Psychiatric: She has a normal mood and affect.  Nursing note and vitals reviewed.    UC Treatments / Results  Labs (all labs ordered are listed, but only abnormal results are displayed) Labs Reviewed - No data to display  EKG None  Radiology No results found.  Procedures Procedures (including critical care time)  Medications Ordered in UC Medications  cefTRIAXone (ROCEPHIN) injection 1 g (1 g Intramuscular Given 07/26/18 1406)    Initial Impression / Assessment and Plan / UC Course  I have reviewed the triage vital signs and the nursing notes.  Pertinent labs & imaging results that were available during my care of the patient were reviewed by me and considered in my medical decision making (see chart for details).    Dental infection- periorbital cellulitis.   Based on the amount of swelling that has come very  rapidly we will go ahead and treat with Rocephin injection in clinic.  We will send home with Augmentin outpatient.  Patient instructed that if the swelling gets worse, moves into her neck, and she is unable to open her mouth she needs to go to the ER.   Patient understanding of situation and  agreeable  to plan Final Clinical Impressions(s) / UC Diagnoses   Final diagnoses:  Dental abscess     Discharge Instructions     It was nice meeting you!!  We will go ahead and give you an injection for the infection here.  I am going to send you home with some antibiotics.  Keep taking the aleve for pain.  If you develop any worsening pain or increased swelling please go to the ER.     ED Prescriptions    Medication Sig Dispense Auth. Provider   amoxicillin-clavulanate (AUGMENTIN) 875-125 MG tablet Take 1 tablet by mouth every 12 (twelve) hours. 14 tablet Dahlia Byes A, NP     Controlled Substance Prescriptions [place] Controlled Substance Registry consulted? Not Applicable        Janace Aris, NP 07/27/18 [phone]

## 2019-03-14 ENCOUNTER — Encounter: Payer: Self-pay | Admitting: Family Medicine

## 2019-03-14 ENCOUNTER — Other Ambulatory Visit: Payer: Self-pay

## 2019-03-14 ENCOUNTER — Ambulatory Visit: Payer: BLUE CROSS/BLUE SHIELD | Admitting: Family Medicine

## 2019-03-14 VITALS — Ht 62.0 in | Wt 130.0 lb

## 2019-03-14 DIAGNOSIS — I10 Essential (primary) hypertension: Secondary | ICD-10-CM | POA: Diagnosis not present

## 2019-03-14 DIAGNOSIS — K047 Periapical abscess without sinus: Secondary | ICD-10-CM | POA: Diagnosis not present

## 2019-03-14 DIAGNOSIS — E1165 Type 2 diabetes mellitus with hyperglycemia: Secondary | ICD-10-CM | POA: Diagnosis not present

## 2019-03-14 MED ORDER — AMOXICILLIN-POT CLAVULANATE 875-125 MG PO TABS: 1.0000 | ORAL_TABLET | Freq: Two times a day (BID) | ORAL | 0 refills | Status: DC

## 2019-03-14 NOTE — Patient Instructions (Signed)
Take the antibiotic twice daily for 10 days. Continue salt water gargles. Continue to use tylenol as needed for pain.  If this doesn't relieve your pain well enough, you may also use ibuprofen (advil/motrin). Contact your dentist for follow-up next week.  It is very important to get your diabetes, blood pressure and cholesterol under control. Our office will be contacting you to schedule an appointment with Kristian CoveyShane Tysinger to have these addressed. If you still have any medication left, please take it.   Dental Abscess  A dental abscess is a collection of pus in or around a tooth that results from an infection. An abscess can cause pain in the affected area as well as other symptoms. Treatment is important to help with symptoms and to prevent the infection from spreading. What are the causes? This condition is caused by a bacterial infection around the root of the tooth that involves the inner part of the tooth (pulp). It may result from:  Severe tooth decay.  Trauma to the tooth, such as a broken or chipped tooth, that allows bacteria to enter into the pulp.  Severe gum disease around a tooth. What increases the risk? This condition is more likely to develop in males. It is also more likely to develop in people who:  Have dental decay (cavities).  Eat sugary snacks between meals.  Use tobacco products.  Have diabetes.  Have a weakened disease-fighting system (immune system).  Do not brush and care for their teeth regularly. What are the signs or symptoms? Symptoms of this condition include:  Severe pain in and around the infected tooth.  Swelling and redness around the infected tooth, in the mouth, or in the face.  Tenderness.  Pus drainage.  Bad breath.  Bitter taste in the mouth.  Difficulty swallowing.  Difficulty opening the mouth.  Nausea.  Vomiting.  Chills.  Swollen neck glands.  Fever. How is this diagnosed? This condition is diagnosed based on:   Your symptoms and your medical and dental history.  An examination of the infected tooth. During the exam, your dentist may tap on the infected tooth. You may also have X-rays of the affected area. How is this treated? This condition is treated by getting rid of the infection. This may be done with:  Incision and drainage. This procedure is done by making an incision in the abscess to drain out the pus. Removing pus is the first priority in treating an abscess.  Antibiotic medicines. These may be used in certain situations.  Antibacterial mouth rinse.  A root canal. This may be performed to save the tooth. Your dentist accesses the visible part of your tooth (crown) with a drill and removes any damaged pulp. Then the space is filled and sealed off.  Tooth extraction. The tooth is pulled out if it cannot be saved by other treatment. You may also receive treatment for pain, such as:  Acetaminophen or NSAIDs.  Gels that contain a numbing medicine.  An injection to block the pain near your nerve. Follow these instructions at home: Medicines  Take over-the-counter and prescription medicines only as told by your dentist.  If you were prescribed an antibiotic, take it as told by your dentist. Do not stop taking the antibiotic even if you start to feel better.  If you were prescribed a gel that contains a numbing medicine, use it exactly as told in the directions. Do not use these gels for children who are younger than 272 years of age.  Do not drive or use heavy machinery while taking prescription pain medicine. General instructions  Rinse out your mouth often with salt water to relieve pain or swelling. To make a salt-water mixture, completely dissolve -1 tsp of salt in 1 cup of warm water.  Eat a soft diet while your abscess is healing.  Drink enough fluid to keep your urine pale yellow.  Do not apply heat to the outside of your mouth.  Do not use any products that contain  nicotine or tobacco, such as cigarettes and e-cigarettes. If you need help quitting, ask your health care provider.  Keep all follow-up visits as told by your dentist. This is important. How is this prevented?  Brush your teeth every morning and night with fluoride toothpaste. Floss one time each day.  Get regularly scheduled dental cleanings.  Consider having a dental sealant applied on teeth that have deep holes (caries).  Drink fluoridated water regularly. This includes most tap water. Check the label on bottled water to see if it contains fluoride.  Drink water instead of sugary drinks.  Eat healthy meals and snacks.  Wear a mouth guard or face shield to protect your teeth while playing sports. Contact a health care provider if:  Your pain is worse and is not helped by medicine. Get help right away if:  You have a fever or chills.  Your symptoms suddenly get worse.  You have a very bad headache.  You have problems breathing or swallowing.  You have trouble opening your mouth.  You have swelling in your neck or around your eye. Summary  A dental abscess is a collection of pus in or around a tooth that results from an infection.  A dental abscess may result from severe tooth decay, trauma to the tooth, or severe gum disease around a tooth.  Symptoms include severe pain, swelling, redness, and drainage of pus in and around the infected tooth.  The first priority in treating a dental abscess is to drain out the pus. Treatment may also involve removing damage inside the tooth (root canal) or pulling out (extracting) the tooth. This information is not intended to replace advice given to you by your health care provider. Make sure you discuss any questions you have with your health care provider. Document Released: 11/21/2005 Document Revised: 07/24/2017 Document Reviewed: 07/24/2017 Elsevier Interactive Patient Education  2019 [employer]   Preventing Diabetes  Mell[place] Complications You can take action to prevent or slow down problems that are caused by diabetes (diabetes mellitus). Following your diabetes plan and taking care of yourself can reduce your risk of serious or life-threatening complications. What actions can I take to prevent diabetes complications? Manage your diabetes   Follow instructions from your health care providers about managing your diabetes. Your diabetes may be managed by a team of health care providers who can teach you how to care for yourself and can answer questions that you have.  Educate yourself about your condition so you can make healthy choices about eating and physical activity.  Check your blood sugar (glucose) levels as often as directed. Your health care provider will help you decide how often to check your blood glucose level depending on your treatment goals and how well you are meeting them.  Ask your health care provider if you should take low-dose aspirin daily and what dose is recommended for you. Taking low-dose aspirin daily is recommended to help prevent cardiovascular disease. Do not use nicotine or tobacco Do not use  any products that contain nicotine or tobacco, such as cigarettes and e-cigarettes. If you need help quitting, ask your health care provider. Nicotine raises your risk for diabetes problems. If you quit using nicotine:  You will lower your risk for heart attack, stroke, nerve disease, and kidney disease.  Your cholesterol and blood pressure may improve.  Your blood circulation will improve. Keep your blood pressure under control Your personal target blood pressure is determined based on:  Your age.  Your medicines.  How long you have had diabetes.  Any other medical conditions you have. To control your blood pressure:  Follow instructions from your health care provider about meal planning, exercise, and medicines.  Make sure your health care provider checks your blood  pressure at every medical visit.  Monitor your blood pressure at home as told by your health care provider.  Keep your cholesterol under control To control your cholesterol:  Follow instructions from your health care provider about meal planning, exercise, and medicines.  Have your cholesterol checked at least once a year.  You may be prescribed medicine to lower cholesterol (statin). If you are not taking a statin, ask your health care provider if you should be. Controlling your cholesterol may:  Help prevent heart disease and stroke. These are the most common health problems for people with diabetes.  Improve your blood flow. Schedule and keep yearly physical exams and eye exams Your health care provider will tell you how often you need medical visits depending on your diabetes management plan. Keep all follow-up visits as directed. This is important so possible problems can be identified early and complications can be avoided or treated.  Every visit with your health care provider should include measuring your: ? Weight. ? Blood pressure. ? Blood glucose control.  Your A1c (hemoglobin A1c) level should be checked: ? At least 2 times a year, if you are meeting your treatment goals. ? 4 times a year, if you are not meeting treatment goals or if your treatment goals have changed.  Your blood lipids (lipid profile) should be checked yearly. You should also be checked yearly for protein in your urine (urine microalbumin).  If you have type 1 diabetes, get an eye exam 3-5 years after you are diagnosed, and then once a year after your first exam.  If you have type 2 diabetes, get an eye exam as soon as you are diagnosed, and then once a year after your first exam. Keep your vaccines current It is recommended that you receive:  A flu (influenza) vaccine every year.  A pneumonia (pneumococcal) vaccine and a hepatitis B vaccine. If you are age 20 or older, you may get the pneumonia  vaccine as a series of two separate shots. Ask your health care provider which other vaccines may be recommended. Take care of your feet Diabetes may cause you to have poor blood circulation to your legs and feet. Because of this, taking care of your feet is very important. Diabetes can cause:  The skin on the feet to get thinner, break more easily, and heal more slowly.  Nerve damage in your legs and feet, which results in decreased feeling. You may not notice minor injuries that could lead to serious problems. To avoid foot problems:  Check your skin and feet every day for cuts, bruises, redness, blisters, or sores.  Schedule a foot exam with your health care provider once every year. This exam includes: ? Inspecting of the structure and skin of your  feet. ? Checking the pulses and sensation in your feet.  Make sure that your health care provider performs a visual foot exam at every medical visit.  Take care of your teeth People with poorly controlled diabetes are more likely to have gum (periodontal) disease. Diabetes can make periodontal diseases harder to control. If not treated, periodontal diseases can lead to tooth loss. To prevent this:  Brush your teeth twice a day.  Floss at least once a day.  Visit your dentist 2 times a year. Drink responsibly Limit alcohol intake to no more than 1 drink a day for nonpregnant women and 2 drinks a day for men. One drink equals 12 oz of beer, 5 oz of wine, or 1 oz of hard liquor.  It is important to eat food when you drink alcohol to avoid low blood glucose (hypoglycemia). Avoid alcohol if you:  Have a history of alcohol abuse or dependence.  Are pregnant.  Have liver disease, pancreatitis, advanced neuropathy, or severe hypertriglyceridemia. Lessen stress Living with diabetes can be stressful. When you are experiencing stress, your blood glucose may be affected in two ways:  Stress hormones may cause your blood glucose to rise.   You may be distracted from taking good care of yourself. Be aware of your stress level and make changes to help you manage challenging situations. To lower your stress levels:  Consider joining a support group.  Do planned relaxation or meditation.  Do a hobby that you enjoy.  Maintain healthy relationships.  Exercise regularly.  Work with your health care provider or a mental health professional. Summary  You can take action to prevent or slow down problems that are caused by diabetes (diabetes mellitus). Following your diabetes plan and taking care of yourself can reduce your risk of serious or life-threatening complications.  Follow instructions from your health care providers about managing your diabetes. Your diabetes may be managed by a team of health care providers who can teach you how to care for yourself and can answer questions that you have.  Your health care provider will tell you how often you need medical visits depending on your diabetes management plan. Keep all follow-up visits as directed. This is important so possible problems can be identified early and complications can be avoided or treated. This information is not intended to replace advice given to you by your health care provider. Make sure you discuss any questions you have with your health care provider. Document Released: 08/09/2011 Document Revised: 07/11/2017 Document Reviewed: 08/20/2016 Elsevier Interactive Patient Education  2019 [employer].
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
## 2019-03-18 ENCOUNTER — Telehealth: Payer: Self-pay | Admitting: Medical

## 2019-03-18 NOTE — Telephone Encounter (Signed)
Patient scheduled for Wed

## 2019-03-18 NOTE — Telephone Encounter (Signed)
Please schedule a virtual med check visit, diabetes  

## 2019-03-20 ENCOUNTER — Ambulatory Visit (INDEPENDENT_AMBULATORY_CARE_PROVIDER_SITE_OTHER): Payer: BLUE CROSS/BLUE SHIELD | Admitting: Medical

## 2019-03-20 ENCOUNTER — Encounter: Payer: Self-pay | Admitting: Medical

## 2019-03-20 ENCOUNTER — Other Ambulatory Visit: Payer: Self-pay

## 2019-03-20 VITALS — Temp 96.7°F | Ht 62.0 in | Wt 130.0 lb

## 2019-03-20 DIAGNOSIS — Z609 Problem related to social environment, unspecified: Secondary | ICD-10-CM | POA: Insufficient documentation

## 2019-03-20 DIAGNOSIS — Z91199 Patient's noncompliance with other medical treatment and regimen due to unspecified reason: Secondary | ICD-10-CM | POA: Insufficient documentation

## 2019-03-20 DIAGNOSIS — Z9119 Patient's noncompliance with other medical treatment and regimen: Secondary | ICD-10-CM | POA: Diagnosis not present

## 2019-03-20 DIAGNOSIS — E1165 Type 2 diabetes mellitus with hyperglycemia: Secondary | ICD-10-CM | POA: Diagnosis not present

## 2019-03-20 DIAGNOSIS — J45909 Unspecified asthma, uncomplicated: Secondary | ICD-10-CM

## 2019-03-20 DIAGNOSIS — I10 Essential (primary) hypertension: Secondary | ICD-10-CM

## 2019-03-20 DIAGNOSIS — F324 Major depressive disorder, single episode, in partial remission: Secondary | ICD-10-CM

## 2019-03-20 MED ORDER — ROSUVASTATIN CALCIUM 20 MG PO TABS: 20.0000 mg | ORAL_TABLET | Freq: Every day | ORAL | 2 refills | Status: DC

## 2019-03-20 MED ORDER — LOSARTAN POTASSIUM 25 MG PO TABS: 25.0000 mg | ORAL_TABLET | Freq: Every day | ORAL | 2 refills | Status: DC

## 2019-03-20 MED ORDER — GLYBURIDE-METFORMIN 2.5-500 MG PO TABS: 1.0000 | ORAL_TABLET | Freq: Two times a day (BID) | ORAL | 2 refills | Status: DC

## 2019-03-20 NOTE — Patient Instructions (Addendum)
Encounter Diagnoses  Name Primary?  Marland Kitchen Uncontrolled type 2 diabetes mellitus with hyperglycemia (HCC) Yes  . Essential hypertension, benign   . High risk social situation   . Noncompliance   . Uncomplicated asthma, unspecified asthma severity, unspecified whether persistent   . Depression, major, single episode, in partial remission (HCC)    I am sorry you had a difficult last 2 years.  Depressed mood  Would stay in contact with your minister and supportive friends that you can talk to regularly  Consider going and seeing a counselor  Just noted that there are good days ahead, and sometimes a trial as we face help [place] come out stronger on the backside where we may help others and encourage others while also building up our own spirit   Diabetes  Begin Glyburide Metformin 2.5/500mg , 1 tablet daily for 1 week, then go to twice daily  This medication can cause some loose stools.  If this becomes a significant problem, then let me know  I sent a glucometer and test supplies to your pharmacy, please let me know if this does not go through or is too costly  Start checking your sugars daily fasting in the morning.  The goal should be less than 130 glucose readings fasting  I suspect your numbers are running high at the moment  Exercise regularly such as walking or running, 150 minutes/week minimum  Try to eat a low-fat low sugar diet  Check your feet every day for sores or wounds  Plan to see your eye doctor and dentist every year  We normally see diabetics every 4 months for routine diabetic care   High blood pressure  Begin losartan 1 tablet daily for blood pressure and to protect the kidneys  You should not get pregnant on this medicine so if you are actively trying to get pregnant, then you should not be taking this medicine  If you are sexually active then we need to discuss birth control while on these medications  I would like you to check your blood pressures either  at home with the blood pressure cuff or at the pharmacy a few times per week to monitor your blood pressures   High cholesterol  I did send Crestor to your pharmacy.  This medicine is used for high cholesterol and heart disease prevention  Do not start this until you have been on the glyburide/metformin and losartan for at least a week and a half to make sure you are tolerating those medicines okay   Lets plan to have you come in for a face-to-face visit and fasting labs in 4 to 6 weeks  In the meantime try to stay away from folks in general to minimize exposure to COVID-19

## 2019-03-20 NOTE — Progress Notes (Signed)
done
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
## 2019-04-08 ENCOUNTER — Encounter: Payer: BLUE CROSS/BLUE SHIELD | Admitting: Obstetrics & Gynecology

## 2019-04-09 ENCOUNTER — Other Ambulatory Visit: Payer: Self-pay

## 2019-04-10 ENCOUNTER — Encounter: Payer: Self-pay | Admitting: Obstetrics & Gynecology

## 2019-04-10 ENCOUNTER — Ambulatory Visit (INDEPENDENT_AMBULATORY_CARE_PROVIDER_SITE_OTHER): Payer: BLUE CROSS/BLUE SHIELD | Admitting: Obstetrics & Gynecology

## 2019-04-10 VITALS — BP 124/80 | Ht 63.0 in | Wt 127.0 lb

## 2019-04-10 DIAGNOSIS — Z1151 Encounter for screening for human papillomavirus (HPV): Secondary | ICD-10-CM

## 2019-04-10 DIAGNOSIS — Z30431 Encounter for routine checking of intrauterine contraceptive device: Secondary | ICD-10-CM | POA: Diagnosis not present

## 2019-04-10 DIAGNOSIS — Z124 Encounter for screening for malignant neoplasm of cervix: Principal | ICD-10-CM

## 2019-04-10 DIAGNOSIS — B3731 Acute candidiasis of vulva and vagina: Secondary | ICD-10-CM

## 2019-04-10 DIAGNOSIS — Z01419 Encounter for gynecological examination (general) (routine) without abnormal findings: Secondary | ICD-10-CM | POA: Diagnosis not present

## 2019-04-10 DIAGNOSIS — B373 Candidiasis of vulva and vagina: Secondary | ICD-10-CM

## 2019-04-10 MED ORDER — FLUCONAZOLE 150 MG PO TABS: 150.0000 mg | ORAL_TABLET | Freq: Every day | ORAL | 2 refills | Status: AC

## 2019-04-10 NOTE — Progress Notes (Signed)
Bailey Stevenson 03-14-1971 [id number]   History:    48 y.o. G1P1L1 Stable relationship  RP:  Established patient presenting for annual gyn exam   HPI: Well on Mirena IUD, but overdue to change as it was inserted in 12/2011. Quit cigarette smoking 09/2018.  No BTB.  No pelvic pain.  No pain with IC.  Last IC 3 days ago, not using condoms.  Urine/BMs normal.  Breasts normal.  BMI 22.50.  Health labs with Fam MD.  Past medical history,surgical history, family history and social history were all reviewed and documented in the EPIC chart.  Gynecologic History No LMP recorded. (Menstrual status: IUD). Contraception: None, as Mirena IUD overdue, insertion 12/2011 Last Pap: 12/2015. Results were: Negative/HPV HR negative Last mammogram: Never Bone Density: Never Colonoscopy: Never  Obstetric History OB History  Gravida Para Term Preterm AB Living  1 1 1     1   SAB TAB Ectopic Multiple Live Births          1    # Outcome Date GA Lbr Len/2nd Weight Sex Delivery Anes PTL Lv  1 Term     M CS-Unspec  N LIV     ROS: A ROS was performed and pertinent positives and negatives are included in the history.  GENERAL: No fevers or chills. HEENT: No change in vision, no earache, sore throat or sinus congestion. NECK: No pain or stiffness. CARDIOVASCULAR: No chest pain or pressure. No palpitations. PULMONARY: No shortness of breath, cough or wheeze. GASTROINTESTINAL: No abdominal pain, nausea, vomiting or diarrhea, melena or bright red blood per rectum. GENITOURINARY: No urinary frequency, urgency, hesitancy or dysuria. MUSCULOSKELETAL: No joint or muscle pain, no back pain, no recent trauma. DERMATOLOGIC: No rash, no itching, no lesions. ENDOCRINE: No polyuria, polydipsia, no heat or cold intolerance. No recent change in weight. HEMATOLOGICAL: No anemia or easy bruising or bleeding. NEUROLOGIC: No headache, seizures, numbness, tingling or weakness. PSYCHIATRIC: No depression, no loss of interest in  normal activity or change in sleep pattern.     Exam:   BP 124/80   Ht 5\' 3"  (1.6 m)   Wt 127 lb (57.6 kg)   BMI 22.50 kg/m   Body mass index is 22.5 kg/m.  General appearance : Well developed well nourished female. No acute distress HEENT: Eyes: no retinal hemorrhage or exudates,  Neck supple, trachea midline, no carotid bruits, no thyroidmegaly Lungs: Clear to auscultation, no rhonchi or wheezes, or rib retractions  Heart: Regular rate and rhythm, no murmurs or gallops Breast:Examined in sitting and supine position were symmetrical in appearance, no palpable masses or tenderness,  no skin retraction, no nipple inversion, no nipple discharge, no skin discoloration, no axillary or supraclavicular lymphadenopathy Abdomen: no palpable masses or tenderness, no rebound or guarding Extremities: no edema or skin discoloration or tenderness  Pelvic: Vulva: Normal             Vagina: No gross lesions or discharge  Cervix: No gross lesions or discharge.  IUD strings not visible, but felt in Endocervical canal.  Pap with high-risk HPV done.  Uterus  RV, normal size, shape and consistency, non-tender and mobile  Adnexa  Without masses or tenderness  Anus: Normal   Assessment/Plan:  48 y.o. female for annual exam   1. Encounter for routine gynecological examination with Papanicolaou smear of cervix Normal gynecologic exam.  Pap with high-risk HPV done today.  Breast exam normal.  Will schedule a screening mammogram now.  Good body mass  index at 22.5.  Health labs with family physician. - PAP,TP IMGw/HPV RNA,rflx HPVTYPE16,18/45  2. Screening for human papillomavirus - PAP,TP IMGw/HPV RNA,rflx HPVTYPE16,18/45  3. Encounter for routine checking of intrauterine contraceptive device (IUD) Overdue for Mirena IUD change.  Strict Condom use until f/u to change IUD.  UPT at f/u in 2 weeks for removal/insertion of new Mirena IUD.  4. Yeast vaginitis Decision to treat with Fluconazole.  Usage  reviewed and prescription sent to pharmacy.  Other orders - fluconazole (DIFLUCAN) 150 MG tablet; Take 1 tablet (150 mg total) by mouth daily for 3 days.    Genia DelMarie-Lyne Tonnie Friedel MD, 2:03 PM 04/10/2019

## 2019-04-11 LAB — PAP, TP IMAGING W/ HPV RNA, RFLX HPV TYPE 16,18/45: HPV DNA High Risk: NOT DETECTED

## 2019-04-12 ENCOUNTER — Encounter: Payer: Self-pay | Admitting: Obstetrics & Gynecology

## 2019-04-12 NOTE — Patient Instructions (Signed)
1. Encounter for routine gynecological examination with Papanicolaou smear of cervix Normal gynecologic exam.  Pap with high-risk HPV done today.  Breast exam normal.  Will schedule a screening mammogram now.  Good body mass index at 22.5.  Health labs with family physician. - PAP,TP IMGw/HPV RNA,rflx HPVTYPE16,18/45  2. Screening for human papillomavirus - PAP,TP IMGw/HPV RNA,rflx HPVTYPE16,18/45  3. Encounter for routine checking of intrauterine contraceptive device (IUD) Overdue for Mirena IUD change.  Strict Condom use until f/u to change IUD.  UPT at f/u in 2 weeks for removal/insertion of new Mirena IUD.  4. Yeast vaginitis Decision to treat with Fluconazole.  Usage reviewed and prescription sent to pharmacy.  Other orders - fluconazole (DIFLUCAN) 150 MG tablet; Take 1 tablet (150 mg total) by mouth daily for 3 days.  Ahni, it was a pleasure meeting you today!  I will inform you of your results as soon as they are available.

## 2019-04-18 ENCOUNTER — Other Ambulatory Visit: Payer: Self-pay

## 2019-04-18 ENCOUNTER — Ambulatory Visit (INDEPENDENT_AMBULATORY_CARE_PROVIDER_SITE_OTHER): Payer: BLUE CROSS/BLUE SHIELD | Admitting: Medical

## 2019-04-18 ENCOUNTER — Encounter: Payer: Self-pay | Admitting: Medical

## 2019-04-18 VITALS — BP 124/80 | HR 92 | Temp 98.0°F | Resp 16 | Ht 63.0 in | Wt 128.8 lb

## 2019-04-18 DIAGNOSIS — Z6379 Other stressful life events affecting family and household: Secondary | ICD-10-CM

## 2019-04-18 DIAGNOSIS — J45909 Unspecified asthma, uncomplicated: Secondary | ICD-10-CM

## 2019-04-18 DIAGNOSIS — E785 Hyperlipidemia, unspecified: Secondary | ICD-10-CM | POA: Insufficient documentation

## 2019-04-18 DIAGNOSIS — E1165 Type 2 diabetes mellitus with hyperglycemia: Secondary | ICD-10-CM | POA: Diagnosis not present

## 2019-04-18 DIAGNOSIS — F329 Major depressive disorder, single episode, unspecified: Secondary | ICD-10-CM

## 2019-04-18 DIAGNOSIS — I10 Essential (primary) hypertension: Secondary | ICD-10-CM

## 2019-04-18 DIAGNOSIS — R4589 Other symptoms and signs involving emotional state: Secondary | ICD-10-CM | POA: Insufficient documentation

## 2019-04-18 LAB — COMPREHENSIVE METABOLIC PANEL
ALT: 42 IU/L — ABNORMAL HIGH (ref 0–32)
AST: 22 IU/L (ref 0–40)
Albumin/Globulin Ratio: 1.7 (ref 1.2–2.2)
Albumin: 4.3 g/dL (ref 3.8–4.8)
Alkaline Phosphatase: 156 IU/L — ABNORMAL HIGH (ref 39–117)
BUN/Creatinine Ratio: 15 (ref 9–23)
BUN: 10 mg/dL (ref 6–24)
Bilirubin Total: 0.3 mg/dL (ref 0.0–1.2)
CO2: 23 mmol/L (ref 20–29)
Calcium: 9.9 mg/dL (ref 8.7–10.2)
Chloride: 102 mmol/L (ref 96–106)
Creatinine, Ser: 0.68 mg/dL (ref 0.57–1.00)
GFR calc Af Amer: 121 mL/min/{1.73_m2} (ref >59)
GFR calc non Af Amer: 105 mL/min/{1.73_m2} (ref >59)
Globulin, Total: 2.5 g/dL (ref 1.5–4.5)
Glucose: 207 mg/dL — ABNORMAL HIGH (ref 65–99)
Potassium: 4.3 mmol/L (ref 3.5–5.2)
Sodium: 139 mmol/L (ref 134–144)
Total Protein: 6.8 g/dL (ref 6.0–8.5)

## 2019-04-18 LAB — LIPID PANEL
Chol/HDL Ratio: 2 ratio (ref 0.0–4.4)
Cholesterol, Total: 131 mg/dL (ref 100–199)
HDL: 67 mg/dL (ref >39)
LDL Calculated: 51 mg/dL (ref 0–99)
Triglycerides: 66 mg/dL (ref 0–149)
VLDL Cholesterol Cal: 13 mg/dL (ref 5–40)

## 2019-04-18 LAB — CBC
Hematocrit: 42.4 % (ref 34.0–46.6)
Hemoglobin: 15 g/dL (ref 11.1–15.9)
MCH: 32.5 pg (ref 26.6–33.0)
MCHC: 35.4 g/dL (ref 31.5–35.7)
MCV: 92 fL (ref 79–97)
Platelets: 149 10*3/uL — ABNORMAL LOW (ref 150–450)
RBC: 4.62 x10E6/uL (ref 3.77–5.28)
RDW: 12.3 % (ref 11.7–15.4)
WBC: 6 10*3/uL (ref 3.4–10.8)

## 2019-04-18 LAB — HEMOGLOBIN A1C
Est. average glucose Bld gHb Est-mCnc: 263 mg/dL
Hgb A1c MFr Bld: 10.8 % — ABNORMAL HIGH (ref 4.8–5.6)
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
## 2019-04-22 ENCOUNTER — Ambulatory Visit (INDEPENDENT_AMBULATORY_CARE_PROVIDER_SITE_OTHER): Payer: BLUE CROSS/BLUE SHIELD | Admitting: Obstetrics & Gynecology

## 2019-04-22 ENCOUNTER — Encounter: Payer: Self-pay | Admitting: Obstetrics & Gynecology

## 2019-04-22 ENCOUNTER — Other Ambulatory Visit: Payer: Self-pay

## 2019-04-22 DIAGNOSIS — Z30432 Encounter for removal of intrauterine contraceptive device: Secondary | ICD-10-CM

## 2019-04-22 DIAGNOSIS — Z30433 Encounter for removal and reinsertion of intrauterine contraceptive device: Secondary | ICD-10-CM

## 2019-04-22 DIAGNOSIS — Z30430 Encounter for insertion of intrauterine contraceptive device: Secondary | ICD-10-CM

## 2019-04-22 DIAGNOSIS — T8332XD Displacement of intrauterine contraceptive device, subsequent encounter: Secondary | ICD-10-CM

## 2019-04-22 LAB — PREGNANCY, URINE: Preg Test, Ur: NEGATIVE

## 2019-04-22 MED ORDER — FLUCONAZOLE 150 MG PO TABS: 150.0000 mg | ORAL_TABLET | Freq: Every day | ORAL | 3 refills | Status: DC

## 2019-04-22 NOTE — Progress Notes (Signed)
[      Bailey Stevenson 01-12-1971 [id number]        48 y.o.  G1P1001 Stable partner  RP: Remove Mirena IUD/Insert a new Mirena IUD  HPI: Mirena IUD inserted in 12/2011, overdue for change.  Protected IC x 04/07/2019.     OB History  Gravida Para Term Preterm AB Living  1 1 1     1   SAB TAB Ectopic Multiple Live Births          1    # Outcome Date GA Lbr Len/2nd Weight Sex Delivery Anes PTL Lv  1 Term     M CS-Unspec  N LIV    Past medical history,surgical history, problem list, medications, allergies, family history and social history were all reviewed and documented in the EPIC chart.   Directed ROS with pertinent positives and negatives documented in the history of present illness/assessment and plan.  Exam:  There were no vitals filed for this visit. General appearance:  Normal  UPT negative                                                                    IUD procedure note       Patient presented to the office today for removal and placement of Mirena IUD. The patient had previously been provided with literature information on this method of contraception. The risks benefits and pros and cons were discussed and all her questions were answered. She is fully aware that this form of contraception is 99% effective and is good for 5 years.  Pelvic exam: Vulva normal Vagina: No lesions or discharge Cervix: No lesions or discharge.  Strings not visible.  Betadine prep/Hurricane spray.  Easy removal of Mirena IUD by grasping and pulling the strings in the Endocervical canal with the IUD removal clamp.  IUD intact and complete.  Shown to patient and discarded. Uterus: RV position, normal volume Adnexa: No masses or tenderness Rectal exam: Not done  The cervix was cleansed with Betadine solution. Hurricane spray on the cervix.  A single-tooth tenaculum was placed on the anterior cervical lip.  Dilation of cervix with the Os Finder. The IUD was shown to the patient and  inserted in a sterile fashion.  Hysterometry with the IUD as being inserted was 7 cm.  The IUD string was trimmed. The single-tooth tenaculum was removed. Patient was instructed to return back to the office in one month for follow up.        Assessment/Plan:  48 y.o. G1P1001   1. Encounter for IUD removal Overdue for Mirena IUD removal, protected intercourse x 2 weeks with negative urine pregnancy test today.  Easy removal of the IUD by grasping and pulling on the lost IUD strings present in the endocervical canal.  IUD intact and complete.  No complication and well-tolerated by the patient.  2. Intrauterine contraceptive device threads lost, subsequent encounter As above.  3. Encounter for insertion of mirena IUD Easy insertion of Mirena IUD.  No complication and well-tolerated by patient.  We will follow-up in 4 weeks for IUD check.  Precautions reviewed with patient. - Pregnancy, urine   Genia Del MD, 10:46 AM 04/22/2019

## 2019-04-23 ENCOUNTER — Encounter: Payer: Self-pay | Admitting: Obstetrics & Gynecology

## 2019-04-23 ENCOUNTER — Other Ambulatory Visit: Payer: Self-pay | Admitting: Medical

## 2019-04-23 ENCOUNTER — Telehealth: Payer: Self-pay | Admitting: Medical

## 2019-04-23 NOTE — Patient Instructions (Signed)
1. Encounter for IUD removal Overdue for Mirena IUD removal, protected intercourse x 2 weeks with negative urine pregnancy test today.  Easy removal of the IUD by grasping and pulling on the lost IUD strings present in the endocervical canal.  IUD intact and complete.  No complication and well-tolerated by the patient.  2. Intrauterine contraceptive device threads lost, subsequent encounter As above.  3. Encounter for insertion of mirena IUD Easy insertion of Mirena IUD.  No complication and well-tolerated by patient.  We will follow-up in 4 weeks for IUD check.  Precautions reviewed with patient. - Pregnancy, urine  Crystalle, it was a pleasure seeing you today!

## 2019-04-23 NOTE — Telephone Encounter (Signed)
Pt called and states she just got her RX for the metformin and she wants to know if she can just double up on the metformin so it equals to the 1000 so she dont have to pay for another refill of medicine pt can be reached at [phone]

## 2019-04-24 ENCOUNTER — Encounter: Payer: Self-pay | Admitting: Anesthesiology

## 2019-04-26 ENCOUNTER — Other Ambulatory Visit: Payer: BLUE CROSS/BLUE SHIELD

## 2019-04-26 ENCOUNTER — Other Ambulatory Visit: Payer: Self-pay

## 2019-04-26 ENCOUNTER — Other Ambulatory Visit: Payer: Self-pay | Admitting: Medical

## 2019-04-26 ENCOUNTER — Encounter: Payer: Self-pay | Admitting: Medical

## 2019-04-26 MED ORDER — INSULIN DEGLUDEC 100 UNIT/ML ~~LOC~~ SOPN: 5.0000 [IU] | PEN_INJECTOR | Freq: Every day | SUBCUTANEOUS | 0 refills | Status: DC

## 2019-05-20 ENCOUNTER — Other Ambulatory Visit: Payer: Self-pay

## 2019-05-21 ENCOUNTER — Encounter: Payer: Self-pay | Admitting: Obstetrics & Gynecology

## 2019-05-21 ENCOUNTER — Ambulatory Visit: Payer: BC Managed Care – PPO | Admitting: Obstetrics & Gynecology

## 2019-05-21 VITALS — BP 130/70

## 2019-05-21 DIAGNOSIS — Z30431 Encounter for routine checking of intrauterine contraceptive device: Secondary | ICD-10-CM

## 2019-05-21 NOTE — Progress Notes (Signed)
    CAIT LOCUST [birth year] [id number]        48 y.o.  G1P1001   RP: Mirena IUD check 4 wks post insertion  HPI: Well on Mirena IUD, new one inserted Apr 22, 2019.  No pelvic pain.  No vaginal bleeding.  No pain with intercourse.  Normal vaginal secretions.  No fever.   OB History  Gravida Para Term Preterm AB Living  1 1 1     1   SAB TAB Ectopic Multiple Live Births          1    # Outcome Date GA Lbr Len/2nd Weight Sex Delivery Anes PTL Lv  1 Term     M CS-Unspec  N LIV    Past medical history,surgical history, problem list, medications, allergies, family history and social history were all reviewed and documented in the EPIC chart.   Directed ROS with pertinent positives and negatives documented in the history of present illness/assessment and plan.  Exam:  There were no vitals filed for this visit. General appearance:  Normal   Gynecologic exam: Vulva normal.  Speculum: Cervix and vagina normal.  No erythema.  IUD strings visible at the external os of the cervix.  Normal vaginal secretions.   Assessment/Plan:  48 y.o. G1P1001   1. IUD check up Mirena IUD in good location with no signs of infection.  Well-tolerated by the patient.  Patient reassured.  Follow-up annual gynecologic exam in May 2021.  Counseling on above issues and coordination of care more than 50% for 15 minutes.   Princess Bruins MD, 10:22 AM 05/21/2019

## 2019-05-21 NOTE — Patient Instructions (Signed)
1. IUD check up Mirena IUD in good location with no signs of infection.  Well-tolerated by the patient.  Patient reassured.  Follow-up annual gynecologic exam in May 2021.  Kammi, it was a pleasure seeing you today!

## 2019-06-13 ENCOUNTER — Other Ambulatory Visit: Payer: Self-pay | Admitting: Medical

## 2019-06-15 ENCOUNTER — Other Ambulatory Visit: Payer: Self-pay | Admitting: Medical

## 2019-06-27 ENCOUNTER — Encounter: Payer: Self-pay | Admitting: Family Medicine

## 2019-06-27 ENCOUNTER — Other Ambulatory Visit: Payer: Self-pay

## 2019-06-27 ENCOUNTER — Ambulatory Visit: Payer: BC Managed Care – PPO | Admitting: Family Medicine

## 2019-06-27 VITALS — BP 110/72 | HR 99 | Temp 97.7°F | Wt 132.2 lb

## 2019-06-27 DIAGNOSIS — K047 Periapical abscess without sinus: Secondary | ICD-10-CM | POA: Diagnosis not present

## 2019-06-27 DIAGNOSIS — K029 Dental caries, unspecified: Secondary | ICD-10-CM

## 2019-06-27 MED ORDER — AMOXICILLIN 875 MG PO TABS: 875.0000 mg | ORAL_TABLET | Freq: Two times a day (BID) | ORAL | 0 refills | Status: DC

## 2019-06-27 NOTE — Progress Notes (Signed)
   Subjective:    Patient ID: Bailey Stevenson, female    DOB: [birth year], 48 y.o.   MRN: [id number]  HPI She is here for evaluation of tooth abscess with pain in the upper left jaw area.  She has had previous difficulty with this and was given antibiotics.  She has been unable to get in with a dentist to get this more definitively cared for.  No fever, chills or lymph node swelling.   Review of Systems     Objective:   Physical Exam Alert and complaining of tooth discomfort.  Left cheek is swollen with slight tenderness palpation.  Exam of the mouth does show multiple caries in the upper jaw area.  No lymphadenopathy is noted.       Assessment & Plan:   Encounter Diagnoses  Name Primary?  . Dental abscess Yes  . Caries involving multiple surfaces of tooth   She was prescribed amoxicillin.  Strongly encouraged her to call her dental insurance carrier to find out who can see her and get this taken care of.  Explained that this can lead to other more dangerous situations in the future.

## 2019-08-27 ENCOUNTER — Telehealth: Payer: Self-pay | Admitting: Medical

## 2019-08-27 MED ORDER — TRESIBA FLEXTOUCH 100 UNIT/ML ~~LOC~~ SOPN: 5.0000 [IU] | PEN_INJECTOR | Freq: Every day | SUBCUTANEOUS | 0 refills | Status: DC

## 2019-08-27 NOTE — Telephone Encounter (Signed)
Pt called and is requesting a rx for tresiba pt [facility] to go [id number] [place], [place] [address]

## 2019-08-27 NOTE — Telephone Encounter (Signed)
Done

## 2019-08-28 ENCOUNTER — Telehealth: Payer: Self-pay | Admitting: Medical

## 2019-08-28 NOTE — Telephone Encounter (Signed)
PT needs [facility]ill on [facility] on [address]

## 2019-08-29 ENCOUNTER — Other Ambulatory Visit: Payer: Self-pay | Admitting: Medical

## 2019-08-29 MED ORDER — TRESIBA FLEXTOUCH 100 UNIT/ML ~~LOC~~ SOPN: 5.0000 [IU] | PEN_INJECTOR | Freq: Every day | SUBCUTANEOUS | 0 refills | Status: DC

## 2019-08-29 NOTE — Telephone Encounter (Signed)
Needs fasting med check or physical appt, whichever is due

## 2019-08-29 NOTE — Telephone Encounter (Signed)
Left pt a Vm

## 2019-09-02 ENCOUNTER — Ambulatory Visit: Payer: BC Managed Care – PPO | Admitting: Medical

## 2019-09-02 ENCOUNTER — Other Ambulatory Visit: Payer: Self-pay

## 2019-09-02 ENCOUNTER — Encounter: Payer: Self-pay | Admitting: Medical

## 2019-09-02 VITALS — BP 130/68 | HR 91 | Temp 98.2°F | Ht 62.0 in | Wt 132.0 lb

## 2019-09-02 DIAGNOSIS — I10 Essential (primary) hypertension: Secondary | ICD-10-CM

## 2019-09-02 DIAGNOSIS — Z7189 Other specified counseling: Secondary | ICD-10-CM | POA: Diagnosis not present

## 2019-09-02 DIAGNOSIS — E1165 Type 2 diabetes mellitus with hyperglycemia: Secondary | ICD-10-CM

## 2019-09-02 DIAGNOSIS — Z7185 Encounter for immunization safety counseling: Secondary | ICD-10-CM

## 2019-09-02 DIAGNOSIS — E785 Hyperlipidemia, unspecified: Secondary | ICD-10-CM | POA: Diagnosis not present

## 2019-09-02 NOTE — Progress Notes (Signed)
Subjective Chief Complaint  Patient presents with  . Medication Management   Here for med check for diabetes and chronic issues.   Diabetes-compliant with glyburide/metformin 2.5/500 mg twice daily, Tresiba 8 units daily at bedtime.  Checking glucose some, not often. But at least 5 days per week.  Highest reading since last visit 167.  Average reading is 140.   No foot concerns.      HTN - Compliant with losartan 25 mg daily   Hyperlipidemia-compliant with Crestor 20 mg daily  Has to be at work at [employer], generally takes her insulin and check sugars early, gets up 4:45am.   Has several questions about diet.  Past Medical History:  Diagnosis Date  . Asthma   . Diabetes mellitus    TYPE II  . Elevated cholesterol   . Essential hypertension, benign 10/13/2017  . Pneumonia   . Trichimoniasis 05/2017   Current Outpatient Medications on File Prior to Visit  Medication Sig Dispense Refill  . glyBURIDE-metformin (GLUCOVANCE) 2.5-500 MG tablet TAKE 1 TABLET BY MOUTH 2 (TWO) TIMES DAILY WITH A MEAL. 60 tablet 2  . insulin degludec (TRESIBA FLEXTOUCH) 100 UNIT/ML SOPN FlexTouch Pen Inject 0.05 mLs (5 Units total) into the skin daily at 10 pm. 3 mL 0  . losartan (COZAAR) 25 MG tablet TAKE 1 TABLET BY MOUTH EVERY DAY 30 tablet 2  . rosuvastatin (CRESTOR) 20 MG tablet TAKE 1 TABLET BY MOUTH EVERY DAY 30 tablet 2  . Blood Glucose Monitoring Suppl ([place] FLEX SYSTEM) w/Device KIT daily. for testing blood sugar    . OneTouch Delica Lancets 10G MISC daily. for testing blood sugar    . ONETOUCH VERIO test strip daily. for testing blood sugar     Current Facility-Administered Medications on File Prior to Visit  Medication Dose Route Frequency Provider Last Rate Last Dose  . levonorgestrel (MIRENA) 20 MCG/24HR IUD   Intrauterine Once Terrance Mass, MD       ROS as in subjective   Objective: BP 130/68   Pulse 91   Temp 98.2 F (36.8 C) (Oral)   Ht '5\' 2"'$  (1.575 m)   Wt 132 lb  (59.9 kg)   SpO2 97%   BMI 24.14 kg/m   Wt Readings from Last 3 Encounters:  09/02/19 132 lb (59.9 kg)  06/27/19 132 lb 3.2 oz (60 kg)  04/18/19 128 lb 12.8 oz (58.4 kg)   General appearance: alert, no distress, WD/WN,  Neck: supple, no lymphadenopathy, no thyromegaly, no masses Heart: RRR, normal S1, S2, no murmurs Lungs: CTA bilaterally, no wheezes, rhonchi, or rales Pulses: 2+ symmetric, upper and lower extremities, normal cap refill No edema   Diabetic Foot Exam - Simple   Simple Foot Form Diabetic Foot exam was performed with the following findings: Yes 09/02/2019  3:24 PM  Visual Inspection No deformities, no ulcerations, no other skin breakdown bilaterally: Yes Sensation Testing Intact to touch and monofilament testing bilaterally: Yes Pulse Check Posterior Tibialis and Dorsalis pulse intact bilaterally: Yes Comments        Assessment: Encounter Diagnoses  Name Primary?  Marland Kitchen Uncontrolled type 2 diabetes mellitus with hyperglycemia ([place]) Yes  . Essential hypertension, benign   . Hyperlipidemia, unspecified hyperlipidemia type   . Vaccine counseling       Plan: Labs today as below counseled on diet recommendations, exercise. C/t same medications We will order egg free flu shot for her Counseled on other needed vaccines, Td, hep B, pneumococcal.  She will consider C/t  glucose monitoring.  She will switch to night time for [place] since she feels stressed trying to do this in the morning.  Described basal insulin vs meal time fast acting, proper use   Collie was seen today for medication management.  Diagnoses and all orders for this visit:  Uncontrolled type 2 diabetes mellitus with hyperglycemia (HCC) -     Comprehensive metabolic panel -     Hemoglobin A1c -     Microalbumin/Creatinine Ratio, Urine -     HM Diabetes Foot Exam -     HM Diabetes Eye Exam  Essential hypertension, benign -     Comprehensive metabolic panel  Hyperlipidemia,  unspecified hyperlipidemia type  Vaccine counseling

## 2019-09-03 ENCOUNTER — Other Ambulatory Visit: Payer: Self-pay | Admitting: Medical

## 2019-09-03 LAB — COMPREHENSIVE METABOLIC PANEL
ALT: 43 IU/L — ABNORMAL HIGH (ref 0–32)
AST: 26 IU/L (ref 0–40)
Albumin/Globulin Ratio: 1.8 (ref 1.2–2.2)
Albumin: 4.4 g/dL (ref 3.8–4.8)
Alkaline Phosphatase: 136 IU/L — ABNORMAL HIGH (ref 39–117)
BUN/Creatinine Ratio: 15 (ref 9–23)
BUN: 10 mg/dL (ref 6–24)
Bilirubin Total: <0.2 mg/dL (ref 0.0–1.2)
CO2: 23 mmol/L (ref 20–29)
Calcium: 9.5 mg/dL (ref 8.7–10.2)
Chloride: 104 mmol/L (ref 96–106)
Creatinine, Ser: 0.68 mg/dL (ref 0.57–1.00)
GFR calc Af Amer: 121 mL/min/{1.73_m2} (ref >59)
GFR calc non Af Amer: 105 mL/min/{1.73_m2} (ref >59)
Globulin, Total: 2.5 g/dL (ref 1.5–4.5)
Glucose: 83 mg/dL (ref 65–99)
Potassium: 4.2 mmol/L (ref 3.5–5.2)
Sodium: 142 mmol/L (ref 134–144)
Total Protein: 6.9 g/dL (ref 6.0–8.5)

## 2019-09-03 LAB — HEMOGLOBIN A1C
Est. average glucose Bld gHb Est-mCnc: 192 mg/dL
Hgb A1c MFr Bld: 8.3 % — ABNORMAL HIGH (ref 4.8–5.6)

## 2019-09-03 LAB — MICROALBUMIN / CREATININE URINE RATIO
Creatinine, Urine: 192.7 mg/dL
Microalb/Creat Ratio: 15 mg/g creat (ref 0–29)
Microalbumin, Urine: 28 ug/mL

## 2019-09-03 MED ORDER — BD PEN NEEDLE NANO U/F 32G X 4 MM MISC: 1.0000 | Freq: Every day | 11 refills | Status: DC

## 2019-09-03 MED ORDER — ROSUVASTATIN CALCIUM 20 MG PO TABS: 20.0000 mg | ORAL_TABLET | Freq: Every day | ORAL | 3 refills | Status: DC

## 2019-09-03 MED ORDER — GLYBURIDE-METFORMIN 2.5-500 MG PO TABS: 1.0000 | ORAL_TABLET | Freq: Two times a day (BID) | ORAL | 0 refills | Status: DC

## 2019-09-03 MED ORDER — LOSARTAN POTASSIUM 25 MG PO TABS: 25.0000 mg | ORAL_TABLET | Freq: Every day | ORAL | 3 refills | Status: DC

## 2019-09-03 MED ORDER — TRESIBA FLEXTOUCH 100 UNIT/ML ~~LOC~~ SOPN: 15.0000 [IU] | PEN_INJECTOR | Freq: Every day | SUBCUTANEOUS | 5 refills | Status: DC

## 2019-09-03 MED ORDER — VITAMIN D 25 MCG (1000 UNIT) PO TABS: 1000.0000 [IU] | ORAL_TABLET | Freq: Every day | ORAL | 3 refills | Status: DC

## 2019-09-04 ENCOUNTER — Telehealth: Payer: Self-pay | Admitting: Medical

## 2019-09-04 NOTE — Telephone Encounter (Signed)
Left message for pt that her egg free vaccine is in and to call back to schedule to come in to get it

## 2019-09-05 ENCOUNTER — Other Ambulatory Visit (INDEPENDENT_AMBULATORY_CARE_PROVIDER_SITE_OTHER): Payer: BC Managed Care – PPO

## 2019-09-05 ENCOUNTER — Other Ambulatory Visit: Payer: Self-pay

## 2019-09-05 DIAGNOSIS — Z23 Encounter for immunization: Secondary | ICD-10-CM

## 2019-11-06 ENCOUNTER — Other Ambulatory Visit: Payer: Self-pay | Admitting: Medical

## 2019-12-10 ENCOUNTER — Other Ambulatory Visit: Payer: Self-pay | Admitting: Medical

## 2020-04-18 ENCOUNTER — Other Ambulatory Visit: Payer: Self-pay | Admitting: Medical

## 2020-04-20 NOTE — Telephone Encounter (Cosign Needed)
Is this ok to refill pt. Last apt was 09/02/19 and has no future apt.

## 2020-04-20 NOTE — Telephone Encounter (Signed)
She is due for fasting physical or med check.  Please schedule and give 30-day supply only

## 2020-04-21 NOTE — Telephone Encounter (Signed)
Lmom for patient to call and schedule a physical. Will send 30 day suppl.

## 2020-05-26 ENCOUNTER — Other Ambulatory Visit: Payer: Self-pay | Admitting: Medical

## 2020-06-01 ENCOUNTER — Other Ambulatory Visit: Payer: Self-pay

## 2020-06-01 ENCOUNTER — Ambulatory Visit: Payer: BC Managed Care – PPO | Admitting: Medical

## 2020-06-01 ENCOUNTER — Encounter: Payer: Self-pay | Admitting: Medical

## 2020-06-01 VITALS — BP 114/80 | HR 106 | Ht 62.0 in | Wt 125.4 lb

## 2020-06-01 DIAGNOSIS — I10 Essential (primary) hypertension: Secondary | ICD-10-CM | POA: Diagnosis not present

## 2020-06-01 DIAGNOSIS — Z1231 Encounter for screening mammogram for malignant neoplasm of breast: Secondary | ICD-10-CM | POA: Insufficient documentation

## 2020-06-01 DIAGNOSIS — Z9119 Patient's noncompliance with other medical treatment and regimen: Secondary | ICD-10-CM

## 2020-06-01 DIAGNOSIS — E1165 Type 2 diabetes mellitus with hyperglycemia: Secondary | ICD-10-CM

## 2020-06-01 DIAGNOSIS — R4589 Other symptoms and signs involving emotional state: Secondary | ICD-10-CM

## 2020-06-01 DIAGNOSIS — R634 Abnormal weight loss: Secondary | ICD-10-CM | POA: Insufficient documentation

## 2020-06-01 DIAGNOSIS — Z7189 Other specified counseling: Secondary | ICD-10-CM

## 2020-06-01 DIAGNOSIS — E785 Hyperlipidemia, unspecified: Secondary | ICD-10-CM | POA: Diagnosis not present

## 2020-06-01 DIAGNOSIS — Z7185 Encounter for immunization safety counseling: Secondary | ICD-10-CM

## 2020-06-01 DIAGNOSIS — Z1211 Encounter for screening for malignant neoplasm of colon: Secondary | ICD-10-CM | POA: Insufficient documentation

## 2020-06-01 DIAGNOSIS — Z91199 Patient's noncompliance with other medical treatment and regimen due to unspecified reason: Secondary | ICD-10-CM

## 2020-06-01 NOTE — Progress Notes (Signed)
Subjective: Chief Complaint  Patient presents with  . Diabetes   Here for med check follow-up.  She was due for follow-up in December 2020.  However this is her first visit since September 2020  Diabetes-at her September 2020 visit we increase Tresiba from 8 units to 10 units.  However she is taking 8 units daily.  She notes that she is taking glyburide/Metformin 1 tablet twice daily.  Not checking sugars.  Hurts to stab fingers regularly, interested in the q2 week libre or other monitoring device.no polydipsia, no polyuria, no increased thirst.  Eats more than 3 time daily.  However, feels like she is losing weight.  Vitamin D deficiency-last visit in September 2020 we added vitamin D supplement.  Counseled on vitamin D deficiency and diet and exercise. she reports compliance with vitamin D 1000 units daily  She is taking losartan 25 mg daily without complaint  Hyperlipidemia - she is compliant with 20 mg of Crestor daily without complaint  She and friend was walking for exercise regularly, but her partner is moving.   Hasn't walked in the past month.  She notes sober for 3 years.  Feeling down and mood lately.  Little depressed.  Felt this way for 2 weeks.  But has had depressed mood in the past.  She saw a counselor back in the 90s but none recently.  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  She just got back from the beach.  She has a 62 year old son lives with him and her boyfriend.  No SI/HI    She notes no recent weight loss.  She feels like she is eating more than 3 times a day but surprised that she is losing weight.  No pain.  No blood in the stool.  No prior mammogram or colonoscopy.  Past Medical History:  Diagnosis Date  . Asthma   . Diabetes mellitus    TYPE II  . Elevated cholesterol   . Essential hypertension, benign 10/13/2017  . Pneumonia   . Trichimoniasis 05/2017   Current Outpatient Medications  on File Prior to Visit  Medication Sig Dispense Refill  . Blood Glucose Monitoring Suppl ([place] FLEX SYSTEM) w/Device KIT daily. for testing blood sugar    . cholecalciferol (VITAMIN D3) 25 MCG (1000 UT) tablet Take 1 tablet (1,000 Units total) by mouth daily. 90 tablet 3  . glyBURIDE-metformin (GLUCOVANCE) 2.5-500 MG tablet TAKE 1 TABLET BY MOUTH 2 (TWO) TIMES DAILY WITH A MEAL. 60 tablet 0  . Insulin Pen Needle (BD PEN NEEDLE NANO U/F) 32G X 4 MM MISC 1 each by Does not apply route at bedtime. 100 each 11  . losartan (COZAAR) 25 MG tablet Take 1 tablet (25 mg total) by mouth daily. 90 tablet 3  . OneTouch Delica Lancets 29J MISC daily. for testing blood sugar    . ONETOUCH VERIO test strip daily. for testing blood sugar    . rosuvastatin (CRESTOR) 20 MG tablet Take 1 tablet (20 mg total) by mouth daily. 90 tablet 3  . TRESIBA FLEXTOUCH 100 UNIT/ML SOPN FlexTouch Pen INJECT 5 UNITS INTO THE SKIN DAILY AT 10 PM. 3 pen 2   Current Facility-Administered Medications on File Prior to Visit  Medication Dose Route Frequency Provider Last Rate Last Admin  . levonorgestrel (MIRENA) 20 MCG/24HR IUD   Intrauterine Once Terrance Mass, MD       ROS as in subjective  Objective: BP 114/80   Pulse (!) 106   Ht _0  (1.575 m)   Wt 125 lb 6.4 oz (56.9 kg)   SpO2 97%   BMI 22.94 kg/m   Wt Readings from Last 3 Encounters:  06/01/20 125 lb 6.4 oz (56.9 kg)  09/02/19 132 lb (59.9 kg)  06/27/19 132 lb 3.2 oz (60 kg)   General appearence: alert, no distress, WD/WN,  Neck: supple, no lymphadenopathy, no thyromegaly, no masses, no bruits Heart: RRR, normal S1, S2, no murmurs Lungs: CTA bilaterally, no wheezes, rhonchi, or rales Abdomen: +bs, soft, non tender, non distended, no masses, no hepatomegaly, no splenomegaly Pulses: 2+ symmetric, upper and lower extremities, normal cap refill  No lower extremity edema Psych: Pleasant, good eye contact, answers questions appropriately Neuro:  Nonfocal exam   Diabetic Foot Exam - Simple   Simple Foot Form Diabetic Foot exam was performed with the following findings: Yes 06/01/2020 12:22 PM  Visual Inspection See comments: Yes Sensation Testing Intact to touch and monofilament testing bilaterally: Yes Pulse Check Posterior Tibialis and Dorsalis pulse intact bilaterally: Yes Comments Some thickened toenails, slight bunion of left great toe otherwise unremarkable exam     Assessment: Encounter Diagnoses  Name Primary?  Marland Kitchen Uncontrolled type 2 diabetes mellitus with hyperglycemia ([place]) Yes  . Essential hypertension, benign   . Hyperlipidemia, unspecified hyperlipidemia type   . Noncompliance   . Encounter for screening mammogram for malignant neoplasm of breast   . Screen for colon cancer   . Depressed mood   . Weight loss   . Vaccine counseling       Plan: Diabetes-we discussed importance of routine follow-up every 3 months for now, counseled on healthy diet and exercise, labs today, she will check insurance about Dexcom or freestyle libre coverage.  Continue daily foot checks, see eye doctor and dentist yearly  Hypertension-continue current medication  Hyperlipidemia-continue current medication  Noncompliance with follow-up and glucose monitoring, otherwise seems to be compliant with therapies  We discussed her recent weight loss concern-I encouraged her to go ahead and get a mammogram and look into insurance coverage for colon cancer screening in general but also given the weight loss concern.  Labs today.  Depressed mood-we spent some time discussing her concerns, PHQ-9 reviewed.  Consider counseling.  She wants to hold off on any kind of medication at this time.  We discussed volunteer opportunities or part-time job for the summer if she needs something to keep her occupied but also we discussed the benefits of serving others and volunteering  I advise she go ahead and get the Covid vaccine now.  I recommend  doing the tetanus and pneumococcal 23 vaccine together about 2 months from now after the Covid vaccine  [place] was seen today for diabetes.  Diagnoses and all orders for this visit:  Uncontrolled type 2 diabetes mellitus with hyperglycemia ([place]) -     Comprehensive metabolic panel -     Hemoglobin A1c -     Lipase  Essential hypertension, benign -     Comprehensive metabolic panel -     Lipase  Hyperlipidemia, unspecified hyperlipidemia type -     Comprehensive metabolic panel -     Lipid panel  Noncompliance  Encounter for screening mammogram for malignant neoplasm of breast -     MM DIGITAL SCREENING BILATERAL; Future  Screen for colon cancer  Depressed mood -     CBC with Differential/Platelet -     TSH  Weight loss -  CBC with Differential/Platelet -     TSH -     Lipase  Vaccine counseling   F/u pending labs

## 2020-06-01 NOTE — Patient Instructions (Signed)
Please call to schedule your mammogram.  [facility]  [phone] [address] [place], [place] [address]    Counseling Services  [facility] [address] [place], [place] [address] [phone]    [facility] [phone]  [email or web address] [address] [place], [place], [place] [address]  Gale Journey, therapist  Or Franchot Erichsen, MA, clinical psychologist    Lenise Arena. Charlyne Mom, therapist [phone] [address] [place], [place] [address]   [facility] [phone] [address] [place], [place] [address]   Glade Lloyd, therapist [phone] [address] [place], [place] [address]   [facility] [phone] [address], [place], [place] [address]    Counseling and Psychiatry Services  [facility] [phone] [address], [place], [place] [address]  Stevphen Meuse, therapist Dr. Meredith Staggers, psychiatrist Dr. Beverly Milch, child psychiatrist   [facility] and staff [phone] [address] [place], [place], [place] [address]

## 2020-06-02 ENCOUNTER — Other Ambulatory Visit: Payer: Self-pay | Admitting: Medical

## 2020-06-02 DIAGNOSIS — R748 Abnormal levels of other serum enzymes: Secondary | ICD-10-CM

## 2020-06-02 DIAGNOSIS — R7989 Other specified abnormal findings of blood chemistry: Secondary | ICD-10-CM

## 2020-06-02 DIAGNOSIS — E1165 Type 2 diabetes mellitus with hyperglycemia: Secondary | ICD-10-CM

## 2020-06-02 LAB — COMPREHENSIVE METABOLIC PANEL
ALT: 64 IU/L — ABNORMAL HIGH (ref 0–32)
AST: 29 IU/L (ref 0–40)
Albumin/Globulin Ratio: 1.5 (ref 1.2–2.2)
Albumin: 4.3 g/dL (ref 3.8–4.8)
Alkaline Phosphatase: 160 IU/L — ABNORMAL HIGH (ref 48–121)
BUN/Creatinine Ratio: 17 (ref 9–23)
BUN: 12 mg/dL (ref 6–24)
Bilirubin Total: 0.3 mg/dL (ref 0.0–1.2)
CO2: 25 mmol/L (ref 20–29)
Calcium: 10.2 mg/dL (ref 8.7–10.2)
Chloride: 102 mmol/L (ref 96–106)
Creatinine, Ser: 0.7 mg/dL (ref 0.57–1.00)
GFR calc Af Amer: 119 mL/min/{1.73_m2} (ref >59)
GFR calc non Af Amer: 104 mL/min/{1.73_m2} (ref >59)
Globulin, Total: 2.9 g/dL (ref 1.5–4.5)
Glucose: 192 mg/dL — ABNORMAL HIGH (ref 65–99)
Potassium: 4.8 mmol/L (ref 3.5–5.2)
Sodium: 138 mmol/L (ref 134–144)
Total Protein: 7.2 g/dL (ref 6.0–8.5)

## 2020-06-02 LAB — CBC WITH DIFFERENTIAL/PLATELET
Basophils Absolute: 0 10*3/uL (ref 0.0–0.2)
Basos: 1 %
EOS (ABSOLUTE): 0.2 10*3/uL (ref 0.0–0.4)
Eos: 4 %
Hematocrit: 44.2 % (ref 34.0–46.6)
Hemoglobin: 15.7 g/dL (ref 11.1–15.9)
Immature Grans (Abs): 0 10*3/uL (ref 0.0–0.1)
Immature Granulocytes: 0 %
Lymphocytes Absolute: 1.8 10*3/uL (ref 0.7–3.1)
Lymphs: 32 %
MCH: 32.6 pg (ref 26.6–33.0)
MCHC: 35.5 g/dL (ref 31.5–35.7)
MCV: 92 fL (ref 79–97)
Monocytes Absolute: 0.4 10*3/uL (ref 0.1–0.9)
Monocytes: 7 %
Neutrophils Absolute: 3.1 10*3/uL (ref 1.4–7.0)
Neutrophils: 56 %
Platelets: 166 10*3/uL (ref 150–450)
RBC: 4.81 x10E6/uL (ref 3.77–5.28)
RDW: 12.4 % (ref 11.7–15.4)
WBC: 5.6 10*3/uL (ref 3.4–10.8)

## 2020-06-02 LAB — LIPASE: Lipase: 37 U/L (ref 14–72)

## 2020-06-02 LAB — TSH: TSH: 0.984 u[IU]/mL (ref 0.450–4.500)

## 2020-06-02 LAB — LIPID PANEL
Chol/HDL Ratio: 2.1 ratio (ref 0.0–4.4)
Cholesterol, Total: 119 mg/dL (ref 100–199)
HDL: 57 mg/dL (ref >39)
LDL Chol Calc (NIH): 47 mg/dL (ref 0–99)
Triglycerides: 70 mg/dL (ref 0–149)
VLDL Cholesterol Cal: 15 mg/dL (ref 5–40)

## 2020-06-02 LAB — HEMOGLOBIN A1C
Est. average glucose Bld gHb Est-mCnc: 263 mg/dL
Hgb A1c MFr Bld: 10.8 % — ABNORMAL HIGH (ref 4.8–5.6)

## 2020-06-02 MED ORDER — TRESIBA FLEXTOUCH 100 UNIT/ML ~~LOC~~ SOPN: 12.0000 [IU] | PEN_INJECTOR | Freq: Every day | SUBCUTANEOUS | 1 refills | Status: DC

## 2020-06-02 MED ORDER — GLYBURIDE-METFORMIN 2.5-500 MG PO TABS: 1.0000 | ORAL_TABLET | Freq: Two times a day (BID) | ORAL | 0 refills | Status: DC

## 2020-06-09 ENCOUNTER — Other Ambulatory Visit: Payer: Self-pay

## 2020-06-09 ENCOUNTER — Ambulatory Visit (INDEPENDENT_AMBULATORY_CARE_PROVIDER_SITE_OTHER): Payer: BC Managed Care – PPO | Admitting: Internal Medicine

## 2020-06-09 ENCOUNTER — Other Ambulatory Visit: Payer: Self-pay | Admitting: Medical

## 2020-06-09 ENCOUNTER — Encounter: Payer: Self-pay | Admitting: Internal Medicine

## 2020-06-09 VITALS — BP 128/84 | HR 99 | Ht 62.0 in | Wt 125.6 lb

## 2020-06-09 DIAGNOSIS — E785 Hyperlipidemia, unspecified: Secondary | ICD-10-CM | POA: Insufficient documentation

## 2020-06-09 DIAGNOSIS — E1165 Type 2 diabetes mellitus with hyperglycemia: Secondary | ICD-10-CM | POA: Insufficient documentation

## 2020-06-09 MED ORDER — VITAMIN D 25 MCG (1000 UNIT) PO TABS: 2000.0000 [IU] | ORAL_TABLET | Freq: Every day | ORAL | 0 refills | Status: DC

## 2020-06-09 MED ORDER — METFORMIN HCL ER 500 MG PO TB24: 500.0000 mg | ORAL_TABLET | ORAL | 1 refills | Status: DC

## 2020-06-09 NOTE — Progress Notes (Signed)
Name: Bailey Stevenson  MRN/ DOB: [id number], [birth year]   Age/ Sex: 49 y.o., female    PCP: Bailey Hurl, PA-C   Reason for Endocrinology Evaluation: Type 2 Diabetes Mellitus     Date of Initial Endocrinology Visit: 06/09/2020     PATIENT IDENTIFIER: [name] is a 49 y.o. female with a past medical history of HTN , T2DM and dyslipidemia . The patient presented for initial endocrinology clinic visit on 06/09/2020 for consultative assistance with her diabetes management.    HPI: Bailey Stevenson was    Diagnosed with DM > 20 yrs ago Prior Medications tried/Intolerance: Has been on oral glycemic agents until 2020 when insulin was started . Farxiga -  , tradjenta Currently checking blood sugars 2 x / day,  before breakfast   Hypoglycemia episodes : no           Hemoglobin A1c has ranged from 8.3% in 2020, peaking at 10.8% in 2021. Patient required assistance for hypoglycemia: no Patient has required hospitalization within the last 1 year from hyper or hypoglycemia: no   In terms of diet, the patient eats 3 meals a day, snacks occasionally    She is a Librarian, academic and a salad prep    HOME DIABETES REGIMEN: Glyburide-Metformin 2.5-500 mg BID  Tresiba 12 units daily    Statin: yes ACE-I/ARB: yes Prior Diabetic Education: Yes   GLUCOSE LOG:  110 -407 mg/dL   DIABETIC COMPLICATIONS: Microvascular complications:    Denies: CKD, retinopathy, neuropathy   Last eye exam: Completed 2020  Macrovascular complications:    Denies: CAD, PVD, CVA   PAST HISTORY: Past Medical History:  Past Medical History:  Diagnosis Date  . Asthma   . Diabetes mellitus    TYPE II  . Elevated cholesterol   . Essential hypertension, benign 10/13/2017  . Pneumonia   . Trichimoniasis 05/2017   Past Surgical History:  Past Surgical History:  Procedure Laterality Date  . CESAREAN SECTION    . CHOLECYSTECTOMY        Social History:  reports that she quit smoking about 21  months ago. Her smoking use included cigarettes. She has a 2.25 pack-year smoking history. She has never used smokeless tobacco. She reports that she does not drink alcohol and does not use drugs. Family History:  Family History  Problem Relation Age of Onset  . Diabetes Maternal Grandmother   . Breast cancer Maternal Grandmother   . Hypertension Mother   . Diabetes Mother   . Kidney failure Mother   . Hypertension Father   . Diabetes Father   . Stroke Father   . Hypertension Maternal Aunt   . Diabetes Maternal Aunt   . Cancer Maternal Aunt        throat  . Hypertension Maternal Uncle   . Diabetes Maternal Uncle   . Hypertension Paternal Aunt   . Diabetes Paternal Aunt   . Hypertension Paternal Uncle   . Diabetes Paternal Uncle      HOME MEDICATIONS: Allergies as of 06/09/2020      Reactions   Shellfish Allergy Anaphylaxis   Throat swelling   Eggs Or Egg-derived Products Nausea And Vomiting      Medication List       Accurate as of June 09, 2020  8:44 AM. If you have any questions, ask your nurse or doctor.        BD Pen Needle Nano U/F 32G X 4 MM Misc Generic drug: Insulin Pen Needle 1  each by Does not apply route at bedtime.   cholecalciferol 25 MCG (1000 UNIT) tablet Commonly known as: VITAMIN D3 Take 1 tablet (1,000 Units total) by mouth daily.   glyBURIDE-metformin 2.5-500 MG tablet Commonly known as: GLUCOVANCE Take 1 tablet by mouth 2 (two) times daily with a meal.   losartan 25 MG tablet Commonly known as: COZAAR Take 1 tablet (25 mg total) by mouth daily.   OneTouch Delica Lancets 51V Misc daily. for testing blood sugar   OneTouch Verio Flex System w/Device Kit daily. 2 times daily for testing blood sugar   OneTouch Verio test strip Generic drug: glucose blood daily. for testing blood sugar   rosuvastatin 20 MG tablet Commonly known as: CRESTOR Take 1 tablet (20 mg total) by mouth daily.   Bailey Stevenson FlexTouch 100 UNIT/ML FlexTouch Pen Generic  drug: insulin degludec Inject 0.12 mLs (12 Units total) into the skin daily.        ALLERGIES: Allergies  Allergen Reactions  . Shellfish Allergy Anaphylaxis    Throat swelling  . Eggs Or Egg-Derived Products Nausea And Vomiting     REVIEW OF SYSTEMS: A comprehensive ROS was conducted with the patient and is negative except as per HPI and below:  Review of Systems  Neurological: Negative for tingling.      OBJECTIVE:   VITAL SIGNS: BP 128/84 (BP Location: Right Arm, Patient Position: Sitting, Cuff Size: Normal)   Pulse 99   Ht 5' 2"  (1.575 m)   Wt 125 lb 9.6 oz (57 kg)   SpO2 98%   BMI 22.97 kg/m    PHYSICAL EXAM:  General: Pt appears well and is in NAD  HEENT:  Eyes: External eye exam normal without stare, lid lag or exophthalmos.  EOM intact.  Neck: General: Supple without adenopathy or carotid bruits. Thyroid: Thyroid size normal.  No goiter or nodules appreciated. No thyroid bruit.  Lungs: Clear with good BS bilat with no rales, rhonchi, or wheezes  Heart: RRR with normal S1 and S2 and no gallops; no murmurs; no rub  Abdomen: Normoactive bowel sounds, soft, nontender, without masses or organomegaly palpable  Extremities:  Lower extremities - No pretibial edema. No lesions.  Skin: Normal texture and temperature to palpation.  Neuro: MS is good with appropriate affect, pt is alert and Ox3    DM foot exam: 06/09/2020  The skin of the feet is intact without sores or ulcerations. The pedal pulses are 2+ on right and 2+ on left. The sensation is intact to a screening 5.07, 10 gram monofilament bilaterally   DATA REVIEWED:  Lab Results  Component Value Date   HGBA1C 10.8 (H) 06/01/2020   HGBA1C 8.3 (H) 09/02/2019   HGBA1C 10.8 (H) 04/18/2019   Lab Results  Component Value Date   MICROALBUR 5.0 10/13/2017   LDLCALC 47 06/01/2020   CREATININE 0.70 06/01/2020   Lab Results  Component Value Date   MICRALBCREAT 15 09/02/2019    Lab Results  Component  Value Date   CHOL 119 06/01/2020   HDL 57 06/01/2020   LDLCALC 47 06/01/2020   TRIG 70 06/01/2020   CHOLHDL 2.1 06/01/2020        ASSESSMENT / PLAN / RECOMMENDATIONS:   1) Type 2 Diabetes Mellitus, Poorly controlled, Without complications - Most recent A1c of 10.8 %. Goal A1c < 7.0 %.    - I have discussed with the patient the pathophysiology of diabetes. We went over the natural progression of the disease. We talked about both  insulin resistance and insulin deficiency. We stressed the importance of lifestyle changes including diet and exercise. I explained the complications associated with diabetes including retinopathy, nephropathy, neuropathy as well as increased risk of cardiovascular disease. We went over the benefit seen with glycemic control.  - I  explained to the patient that diabetic patients are at higher than normal risk for amputations.  - BMI is low for a T2Dm at 22, I have recommended checking for auto antibodies but the pt would like to hold off on this at this time.  - Discussed the importance of avoiding sugar-sweetened beverages and avoiding snacks, discussed low carb options for snacks.   - Will stop Glyburide, titrate metformin and continue insulin at this time   MEDICATIONS: - Stop Glyburide-metformin  - Start Metformin 500 mg, 2 tablet with Breakfast and 1 tablet with supper - Continue Tresiba at 12 units daily     EDUCATION / INSTRUCTIONS:  BG monitoring instructions: Patient is instructed to check her blood sugars 1 times a day, fasting  Call [facility] if: BG persistently < 70. . I reviewed the Rule of 15 for the treatment of hypoglycemia in detail with the patient. Literature supplied.   2) Diabetic complications:   Eye: Does not have known diabetic retinopathy.   Neuro/ Feet: Does not have known diabetic peripheral neuropathy.  Renal: Patient does not have known baseline CKD. She is on an ACEI/ARB at present.   3) Lipids:  Patient is on rosuvastatin 20 mg daily . LDL at goal. Discussed cardiovascular benefits of statins         Signed electronically by: Mack Guise, MD  [facility]  [facility] [address] [place], [place] [address] Phone: [phone] FAX: [phone]   CC: Bailey Hurl, PA-C [place] [address] Phone: [phone]  Fax: [phone]    Return to Endocrinology clinic as below: Future Appointments  Date Time Provider [place]  06/12/2020  8:00 AM [facility] [place] [facility]  10/02/2020  2:00 PM Tysinger, Camelia Eng, PA-C [facility]

## 2020-06-09 NOTE — Patient Instructions (Addendum)
-   Stop Glyburide-metformin  - Start Metformin 500 mg, 2 tablet with Breakfast and 1 tablet with supper - Continue Tresiba at 12 units daily     Choose healthy, lower carb lower calorie snacks: toss salad, cooked vegetables, cottage cheese, peanut butter, low fat cheese / string cheese, lower sodium deli meat, tuna salad or chicken salad     HOW TO TREAT LOW BLOOD SUGARS (Blood sugar LESS THAN 70 MG/DL)  Please follow the RULE OF 15 for the treatment of hypoglycemia treatment (when your (blood sugars are less than 70 mg/dL)    STEP 1: Take 15 grams of carbohydrates when your blood sugar is low, which includes:   3-4 GLUCOSE TABS  OR  3-4 OZ OF JUICE OR REGULAR SODA OR  ONE TUBE OF GLUCOSE GEL     STEP 2: RECHECK blood sugar in 15 MINUTES STEP 3: If your blood sugar is still low at the 15 minute recheck --> then, go back to STEP 1 and treat AGAIN with another 15 grams of carbohydrates.;l

## 2020-06-10 LAB — SPECIMEN STATUS REPORT

## 2020-06-10 LAB — ALKALINE PHOSPHATASE, ISOENZYMES
Alkaline Phosphatase: 164 IU/L — ABNORMAL HIGH (ref 48–121)
BONE FRACTION: 21 % (ref 14–68)
INTESTINAL FRAC.: 8 % (ref 0–18)
LIVER FRACTION: 71 % (ref 18–85)

## 2020-06-12 ENCOUNTER — Ambulatory Visit
Admission: RE | Admit: 2020-06-12 | Discharge: 2020-06-12 | Disposition: A | Discharge status: Home / Self Care | Payer: BC Managed Care – PPO | Source: Ambulatory Visit | Attending: Medical | Admitting: Medical

## 2020-06-12 DIAGNOSIS — R748 Abnormal levels of other serum enzymes: Secondary | ICD-10-CM

## 2020-06-12 DIAGNOSIS — R7989 Other specified abnormal findings of blood chemistry: Secondary | ICD-10-CM

## 2020-06-15 ENCOUNTER — Other Ambulatory Visit: Payer: Self-pay | Admitting: Medical

## 2020-06-15 DIAGNOSIS — R1900 Intra-abdominal and pelvic swelling, mass and lump, unspecified site: Secondary | ICD-10-CM

## 2020-06-15 DIAGNOSIS — K769 Liver disease, unspecified: Secondary | ICD-10-CM

## 2020-06-30 ENCOUNTER — Other Ambulatory Visit: Payer: Self-pay | Admitting: Medical

## 2020-07-09 ENCOUNTER — Other Ambulatory Visit: Payer: Self-pay | Admitting: Medical

## 2020-07-11 ENCOUNTER — Other Ambulatory Visit: Payer: BC Managed Care – PPO

## 2020-08-08 ENCOUNTER — Other Ambulatory Visit: Payer: Self-pay

## 2020-08-30 ENCOUNTER — Other Ambulatory Visit: Payer: Self-pay | Admitting: Medical

## 2020-08-31 NOTE — Telephone Encounter (Signed)
Vitamin D level not checked since 2017 but at Diabetes check recently shane noted added vitamin D supplement in to regimen. Ok to refill? 

## 2020-08-31 NOTE — Telephone Encounter (Signed)
Vitamin D level not checked since 2017 but at Diabetes check recently shane noted added vitamin D supplement in to regimen. Ok to refill?

## 2020-09-09 ENCOUNTER — Ambulatory Visit: Payer: BC Managed Care – PPO | Admitting: Internal Medicine

## 2020-09-15 ENCOUNTER — Ambulatory Visit: Payer: Self-pay | Admitting: Internal Medicine

## 2020-09-28 ENCOUNTER — Other Ambulatory Visit: Payer: Self-pay | Admitting: Medical

## 2020-10-02 ENCOUNTER — Ambulatory Visit: Payer: BC Managed Care – PPO | Admitting: Medical

## 2020-10-05 ENCOUNTER — Encounter: Payer: Self-pay | Admitting: Medical

## 2022-06-27 ENCOUNTER — Encounter (HOSPITAL_COMMUNITY): Payer: Self-pay

## 2022-06-27 ENCOUNTER — Ambulatory Visit (HOSPITAL_COMMUNITY)
Admission: EM | Admit: 2022-06-27 | Discharge: 2022-06-27 | Disposition: A | Discharge status: Home / Self Care | Payer: 59 | Attending: Family Medicine | Admitting: Family Medicine

## 2022-06-27 DIAGNOSIS — L03211 Cellulitis of face: Secondary | ICD-10-CM | POA: Diagnosis not present

## 2022-06-27 MED ORDER — KETOROLAC TROMETHAMINE 30 MG/ML IJ SOLN
INTRAMUSCULAR | Status: AC
  Filled 2022-06-27: qty 1

## 2022-06-27 MED ORDER — KETOROLAC TROMETHAMINE 30 MG/ML IJ SOLN: 30.0000 mg | Freq: Once | INTRAMUSCULAR | Status: DC

## 2022-06-27 MED ORDER — AMOXICILLIN-POT CLAVULANATE 875-125 MG PO TABS: 1.0000 | ORAL_TABLET | Freq: Two times a day (BID) | ORAL | 0 refills | Status: AC

## 2022-06-27 MED ORDER — IBUPROFEN 800 MG PO TABS: 800.0000 mg | ORAL_TABLET | Freq: Three times a day (TID) | ORAL | 0 refills | Status: DC | PRN

## 2022-06-27 NOTE — ED Triage Notes (Signed)
Pt reports oral swelling and pain x 2 days.

## 2022-06-27 NOTE — Discharge Instructions (Addendum)
You have been given a shot of Toradol 30 mg today.  Take amoxicillin-clavulanate 875 mg--1 tab twice daily with food for 7 days  Take ibuprofen 800 mg--1 tab every 8 hours as needed for pain.  Please call your primary care office to reestablish with them.  Please call your insurance to find a dentist

## 2022-06-27 NOTE — ED Provider Notes (Signed)
[facility]    CSN: [id number] Arrival date & time: 06/27/22  9201      History   Chief Complaint Chief Complaint  Patient presents with   Dental Pain   Oral Swelling    HPI Bailey Stevenson is a 51 y.o. female.    Dental Pain  Here with anterior tooth and oral pain that began on July 20.  She did rinse her mouth then and on July 21 she started noticing a little swelling in her upper lip.  Then over time the pain has intensified and she has had more swelling in both cheeks.  No fever or chills.  No upper respiratory symptoms.  No purulent drainage in her mouth, but she did notice a little blood in her mouth today.  She does have a history of diabetes, and has not seen her primary care office in about 2 years.  She does not have a dentist but she does have dental insurance.  Past Medical History:  Diagnosis Date   Asthma    Diabetes mellitus    TYPE II   Elevated cholesterol    Essential hypertension, benign 10/13/2017   Pneumonia    Trichimoniasis 05/2017    Patient Active Problem List   Diagnosis Date Noted   Type 2 diabetes mellitus with hyperglycemia, without long-term current use of insulin ([facility]) 06/09/2020   Dyslipidemia 06/09/2020   Encounter for screening mammogram for malignant neoplasm of breast 06/01/2020   Screen for colon cancer 06/01/2020   Weight loss 06/01/2020   Hyperlipidemia 04/18/2019   Depressed mood 04/18/2019   Other stressful life events affecting family and household 04/18/2019   High risk social situation 03/20/2019   Noncompliance 00/71/2197   Uncomplicated asthma 58/83/2549   Depression, major, single episode, in partial remission ([facility]) 03/20/2019   Routine general medical examination at a health care facility 10/13/2017   Essential hypertension, benign 10/13/2017   Vaccine counseling 10/13/2017   Tachycardia 10/13/2017   Ovarian cyst, right 01/06/2016   Diabetes mellitus type 2, uncontrolled ([facility]) 12/19/2011    Past  Surgical History:  Procedure Laterality Date   CESAREAN SECTION     CHOLECYSTECTOMY      OB History     Gravida  1   Para  1   Term  1   Preterm      AB      Living  1      SAB      IAB      Ectopic      Multiple      Live Births  1            Home Medications    Prior to Admission medications   Medication Sig Start Date End Date Taking? Authorizing Provider  amoxicillin-clavulanate (AUGMENTIN) 875-125 MG tablet Take 1 tablet by mouth 2 (two) times daily for 7 days. 06/27/22 07/04/22 Yes Barrett Henle, MD  ibuprofen (ADVIL) 800 MG tablet Take 1 tablet (800 mg total) by mouth every 8 (eight) hours as needed (pain). 06/27/22  Yes Barrett Henle, MD  Blood Glucose Monitoring Suppl ([place]) w/Device KIT daily. 2 times daily for testing blood sugar 03/20/19   [provider]  cholecalciferol (VITAMIN D3) 25 MCG (1000 UNIT) tablet Take 2 tablets (2,000 Units total) by mouth daily. 06/09/20   Tysinger, Camelia Eng, PA-C  [facility] D3 25 MCG (1000 UT) capsule TAKE 1 CAPSULE BY MOUTH EVERY DAY 08/31/20   Jill Alexanders  C, MD  insulin degludec (TRESIBA FLEXTOUCH) 100 UNIT/ML FlexTouch Pen Inject 0.12 mLs (12 Units total) into the skin daily. 06/02/20   Tysinger, Camelia Eng, PA-C  Insulin Pen Needle (BD PEN NEEDLE NANO U/F) 32G X 4 MM MISC 1 each by Does not apply route at bedtime. 09/03/19   Tysinger, Camelia Eng, PA-C  losartan (COZAAR) 25 MG tablet TAKE 1 TABLET BY MOUTH EVERY DAY 09/28/20   Tysinger, Camelia Eng, PA-C  metFORMIN (GLUCOPHAGE-XR) 500 MG 24 hr tablet Take 1 tablet (500 mg total) by mouth as directed. 06/09/20   Shamleffer, Melanie Crazier, MD  OneTouch Delica Lancets 09O MISC daily. for testing blood sugar 03/20/19   [provider]  [facility] VERIO test strip daily. for testing blood sugar 05/12/19   [provider]  rosuvastatin (CRESTOR) 20 MG tablet Take 1 tablet (20 mg total) by mouth daily. 09/03/19   Tysinger, Camelia Eng, PA-C     Family History Family History  Problem Relation Age of Onset   Diabetes Maternal Grandmother    Breast cancer Maternal Grandmother    Hypertension Mother    Diabetes Mother    Kidney failure Mother    Hypertension Father    Diabetes Father    Stroke Father    Hypertension Maternal Aunt    Diabetes Maternal Aunt    Cancer Maternal Aunt        throat   Hypertension Maternal Uncle    Diabetes Maternal Uncle    Hypertension Paternal Aunt    Diabetes Paternal Aunt    Hypertension Paternal Uncle    Diabetes Paternal Uncle     Social History Social History   Tobacco Use   Smoking status: Former    Packs/day: 0.25    Years: 9.00    Total pack years: 2.25    Types: Cigarettes    Quit date: 09/04/2018    Years since quitting: 3.8   Smokeless tobacco: Never  Vaping Use   Vaping Use: Never used  Substance Use Topics   Alcohol use: Never   Drug use: No     Allergies   Shellfish allergy and Eggs or egg-derived products   Review of Systems Review of Systems   Physical Exam Triage Vital Signs ED Triage Vitals [06/27/22 1042]  Enc Vitals Group     BP (!) 143/85     Pulse Rate 93     Resp 18     Temp 98.2 F (36.8 C)     Temp Source Oral     SpO2 97 %     Weight      Height      Head Circumference      Peak Flow      Pain Score      Pain Loc      Pain Edu?      Excl. in [place]?    No data found.  Updated Vital Signs BP (!) 143/85 (BP Location: Left Arm)   Pulse 93   Temp 98.2 F (36.8 C) (Oral)   Resp 18   SpO2 97%   Visual Acuity Right Eye Distance:   Left Eye Distance:   Bilateral Distance:    Right Eye Near:   Left Eye Near:    Bilateral Near:     Physical Exam Vitals reviewed.  Constitutional:      General: She is not in acute distress.    Appearance: She is not ill-appearing, toxic-appearing or diaphoretic.  HENT:     Nose:  Nose normal.     Mouth/Throat:     Mouth: Mucous membranes are moist.     Pharynx: No oropharyngeal exudate  or posterior oropharyngeal erythema.     Comments: There is some diffuse swelling of her upper lip.  There is no fluctuant area or mass.  There are also some milder diffuse swelling of both upper cheeks.  The anterior incisors are all broken on the upper dental ridge.  No swelling or erythema there. Eyes:     Extraocular Movements: Extraocular movements intact.     Conjunctiva/sclera: Conjunctivae normal.     Pupils: Pupils are equal, round, and reactive to light.  Cardiovascular:     Rate and Rhythm: Normal rate and regular rhythm.     Heart sounds: No murmur heard. Pulmonary:     Effort: Pulmonary effort is normal.     Breath sounds: Normal breath sounds.  Musculoskeletal:     Cervical back: Neck supple.  Lymphadenopathy:     Cervical: No cervical adenopathy.  Skin:    Coloration: Skin is not jaundiced or pale.  Neurological:     Mental Status: She is alert and oriented to person, place, and time.  Psychiatric:        Behavior: Behavior normal.      UC Treatments / Results  Labs (all labs ordered are listed, but only abnormal results are displayed) Labs Reviewed - No data to display  EKG   Radiology No results found.  Procedures Procedures (including critical care time)  Medications Ordered in UC Medications  ketorolac (TORADOL) 30 MG/ML injection 30 mg (has no administration in time range)    Initial Impression / Assessment and Plan / UC Course  I have reviewed the triage vital signs and the nursing notes.  Pertinent labs & imaging results that were available during my care of the patient were reviewed by me and considered in my medical decision making (see chart for details).     Will treat with Augmentin and provide pain relief.  We discussed her getting back in with her primary care to take care of her diabetes.  Also she is going to call her insurance about finding a Pharmacist, community. Final Clinical Impressions(s) / UC Diagnoses   Final diagnoses:  Facial  cellulitis     Discharge Instructions      You have been given a shot of Toradol 30 mg today.  Take amoxicillin-clavulanate 875 mg--1 tab twice daily with food for 7 days  Take ibuprofen 800 mg--1 tab every 8 hours as needed for pain.  Please call your primary care office to reestablish with them.  Please call your insurance to find a dentist     ED Prescriptions     Medication Sig Dispense Auth. Provider   amoxicillin-clavulanate (AUGMENTIN) 875-125 MG tablet Take 1 tablet by mouth 2 (two) times daily for 7 days. 14 tablet Niva Murren, Gwenlyn Perking, MD   ibuprofen (ADVIL) 800 MG tablet Take 1 tablet (800 mg total) by mouth every 8 (eight) hours as needed (pain). 21 tablet Rossana Molchan, Gwenlyn Perking, MD      I have reviewed the PDMP during this encounter.   Barrett Henle, MD 06/27/22 [phone]

## 2022-07-01 ENCOUNTER — Ambulatory Visit: Payer: 59 | Admitting: Medical

## 2022-07-01 ENCOUNTER — Encounter: Payer: Self-pay | Admitting: Medical

## 2022-07-01 VITALS — BP 130/84 | HR 97 | Temp 98.6°F | Wt 124.0 lb

## 2022-07-01 DIAGNOSIS — E785 Hyperlipidemia, unspecified: Secondary | ICD-10-CM

## 2022-07-01 DIAGNOSIS — Z609 Problem related to social environment, unspecified: Secondary | ICD-10-CM | POA: Diagnosis not present

## 2022-07-01 DIAGNOSIS — I10 Essential (primary) hypertension: Secondary | ICD-10-CM | POA: Diagnosis not present

## 2022-07-01 DIAGNOSIS — E1165 Type 2 diabetes mellitus with hyperglycemia: Secondary | ICD-10-CM | POA: Diagnosis not present

## 2022-07-01 DIAGNOSIS — K029 Dental caries, unspecified: Secondary | ICD-10-CM | POA: Insufficient documentation

## 2022-07-01 DIAGNOSIS — R4589 Other symptoms and signs involving emotional state: Secondary | ICD-10-CM

## 2022-07-01 NOTE — Progress Notes (Signed)
Subjective:  Bailey Stevenson is a 51 y.o. female who presents for Chief Complaint  Patient presents with   Diabetes    Diabetes check, has not been in in a while because of ins. Issues also wants to discuss depression issues      Here for med check.  Last visit here 2021.  She notes that she had some hardships at work with finances over the past 2 years.  In the past 2 years she was working full-time but her employer had labeled her as part-time and she was not able to have insurance for the period of time.  This really caused some difficulty for her as she was unable to get her medications or come in for care.  She could not afford to do this without insurance.  Thus she has been without her medication for about a year and a half.  She also does not have a way to check her sugars currently.  Recently she was seen by urgent care.  Saw urgent care last week due to facial swelling, felt like face was heavy and hurting.   Was put on antibiotics for infection in mouth.  She still is on the antibiotics currently  She also notes depressed mood for the past year.  No SI/HI.  She is not currently exercising.  She is working full-time in [employer].  She denies polyuria, polydipsia, blurred vision.  No recent weight changes.  Not currently seeing counseling.  She is seeing her dentist and gynecologist soon.  Lives with son and his father  No other aggravating or relieving factors.    No other c/o.  Past Medical History:  Diagnosis Date   Asthma    Diabetes mellitus    TYPE II   Elevated cholesterol    Essential hypertension, benign 10/13/2017   Pneumonia    Trichimoniasis 05/2017   Current Outpatient Medications on File Prior to Visit  Medication Sig Dispense Refill   amoxicillin-clavulanate (AUGMENTIN) 875-125 MG tablet Take 1 tablet by mouth 2 (two) times daily for 7 days. 14 tablet 0   Blood Glucose Monitoring Suppl ([place]) w/Device KIT daily. 2 times daily  for testing blood sugar (Patient not taking: Reported on 07/01/2022)     cholecalciferol (VITAMIN D3) 25 MCG (1000 UNIT) tablet Take 2 tablets (2,000 Units total) by mouth daily. (Patient not taking: Reported on 07/01/2022) 180 tablet 0   [facility] D3 25 MCG (1000 UT) capsule TAKE 1 CAPSULE BY MOUTH EVERY DAY (Patient not taking: Reported on 07/01/2022) 90 capsule 3   ibuprofen (ADVIL) 800 MG tablet Take 1 tablet (800 mg total) by mouth every 8 (eight) hours as needed (pain). (Patient not taking: Reported on 07/01/2022) 21 tablet 0   insulin degludec (TRESIBA FLEXTOUCH) 100 UNIT/ML FlexTouch Pen Inject 0.12 mLs (12 Units total) into the skin daily. (Patient not taking: Reported on 07/01/2022) 15 mL 1   Insulin Pen Needle (BD PEN NEEDLE NANO U/F) 32G X 4 MM MISC 1 each by Does not apply route at bedtime. (Patient not taking: Reported on 07/01/2022) 100 each 11   losartan (COZAAR) 25 MG tablet TAKE 1 TABLET BY MOUTH EVERY DAY (Patient not taking: Reported on 07/01/2022) 90 tablet 0   metFORMIN (GLUCOPHAGE-XR) 500 MG 24 hr tablet Take 1 tablet (500 mg total) by mouth as directed. (Patient not taking: Reported on 07/01/2022) 270 tablet 1   OneTouch Delica Lancets 32N MISC daily. for testing blood sugar (Patient not taking: Reported on  07/01/2022)     ONETOUCH VERIO test strip daily. for testing blood sugar (Patient not taking: Reported on 07/01/2022)     rosuvastatin (CRESTOR) 20 MG tablet Take 1 tablet (20 mg total) by mouth daily. (Patient not taking: Reported on 07/01/2022) 90 tablet 3   Current Facility-Administered Medications on File Prior to Visit  Medication Dose Route Frequency Provider Last Rate Last Admin   levonorgestrel (MIRENA) 20 MCG/24HR IUD   Intrauterine Once Terrance Mass, MD        The following portions of the patient's history were reviewed and updated as appropriate: allergies, current medications, past family history, past medical history, past social history, past surgical history and  problem list.  ROS Otherwise as in subjective above  Objective: BP 130/84   Pulse 97   Temp 98.6 F (37 C)   Wt 124 lb (56.2 kg)   BMI 22.68 kg/m   General appearance: alert, no distress, well developed, well nourished Oral cavity: MMM, multiple areas of decay upper and lower posterior molars Neck: supple, no lymphadenopathy, no thyromegaly, no masses Heart: RRR, normal S1, S2, no murmurs Lungs: CTA bilaterally, no wheezes, rhonchi, or rales Pulses: 2+ radial pulses, 2+ pedal pulses, normal cap refill Ext: no edema   Assessment: Encounter Diagnoses  Name Primary?   Type 2 diabetes mellitus with hyperglycemia, without long-term current use of insulin (HCC) Yes   Hyperlipidemia, unspecified hyperlipidemia type    High risk social situation    Essential hypertension, benign    Depressed mood    Tooth decay      Plan: We discussed her concerns and symptoms.  It was unfortunate she was not able to come in for follow-up for the last year and a half.  We discussed that in the future if she runs into any issues like this again to let us know right away as they are indigent care clinics and other ways to help keep her on therapy for diabetes.  We discussed that there are serious long-term risk of uncontrolled diabetes.  I gave her a list of counselors to pursue counseling.  We discussed her mood and ways to cope with depressed mood and mental health concerns.  We discussed exercise, having a hobby, spending time with friends.  She will reach out to the counseling.  She declines medication for mood at this time.  Update labs today so we can decide what medicines to restart.  When I last saw her in 2021 she was on Glucovance, Tresiba 5 units daily, vitamin D, losartan 25 mg and Crestor.  We will likely start back on most of these medications.  Gave her a glucometer kit today to start testing sugars.  We discussed glucometer testing.  She has follow-up with dentist very soon given  tooth decay and infection.  She will see her gynecologist soon as well  Bailey Stevenson was seen today for diabetes.  Diagnoses and all orders for this visit:  Type 2 diabetes mellitus with hyperglycemia, without long-term current use of insulin (HCC) -     Hemoglobin A1c -     Comprehensive metabolic panel  Hyperlipidemia, unspecified hyperlipidemia type  High risk social situation  Essential hypertension, benign  Depressed mood  Tooth decay    Follow up: pending labs

## 2022-07-01 NOTE — Patient Instructions (Addendum)
RESOURCES in [place], [place]  If you are experiencing a mental health crisis or an emergency, please call 911 or go to the nearest emergency department.  [facility]   [phone] [facility]  [phone] [facility]   [phone]  Suicide Hotline 1-800-Suicide 507-234-2047)  National Suicide Prevention Lifeline 830-294-1128  7827608369)  Domestic Violence, Rape/Crisis - [facility] [place] 741-638-4536  [employer] Violence Hotline 1-800-799-SAFE 947-532-6687)  To report Child or Elder Abuse, please call: [facility] Police Department  [phone] [facility] Department  [phone]  Teen Crisis line [phone] or [phone]    [facility] Crisis Line and Main phone number [phone]  Behavioral Health Urgent Care  [phone]  Behavioral Health Outpatient Clinic [phone]  Adult Crisis Center [phone]   [facility] [address] [place], [place], [place] [address] [phone]   Dr. Nicole Cella, [place] [phone] [place], [place] [address]   [facility] [address] [place], [place] [address] [phone]   [facility] [phone] [address] [place], [place], [place] [address]   [facility] [phone] office [address], [place], [place] [address] Crisis services, Family support, in home therapy, treatment for Anxiety, PTSD, Sexual Assault, Substance Abuse, Financial/Credit Counseling, Variety of other services         Indigent Care Center options  If you do not have insurance, or if you have Medicaid, the following are options for health care that may be more affordable for you  We do not want you to sacrifice care, to avoid taking medications, or avoid following up on evaluation and treatment  recommendations due to cost if we can find other helpful solutions    Primary Care at [facility] [address] [place], [place] [address] [phone]   [facility] [address] [place], [place] [address] [phone]   [facility] Department   [address] Caffie Damme [place], [place] [address] [phone]   [facility] [address] [place], [place], [place] [address] [phone]

## 2022-07-02 ENCOUNTER — Emergency Department (HOSPITAL_COMMUNITY)
Admission: EM | Admit: 2022-07-02 | Discharge: 2022-07-02 | Disposition: A | Discharge status: Home / Self Care | Payer: 59 | Attending: Emergency Medicine | Admitting: Emergency Medicine

## 2022-07-02 ENCOUNTER — Other Ambulatory Visit: Payer: Self-pay | Admitting: Medical

## 2022-07-02 ENCOUNTER — Other Ambulatory Visit: Payer: Self-pay

## 2022-07-02 ENCOUNTER — Encounter (HOSPITAL_COMMUNITY): Payer: Self-pay

## 2022-07-02 DIAGNOSIS — E1169 Type 2 diabetes mellitus with other specified complication: Secondary | ICD-10-CM

## 2022-07-02 DIAGNOSIS — J45909 Unspecified asthma, uncomplicated: Secondary | ICD-10-CM | POA: Diagnosis not present

## 2022-07-02 DIAGNOSIS — K047 Periapical abscess without sinus: Secondary | ICD-10-CM | POA: Diagnosis not present

## 2022-07-02 DIAGNOSIS — E1165 Type 2 diabetes mellitus with hyperglycemia: Secondary | ICD-10-CM | POA: Diagnosis present

## 2022-07-02 DIAGNOSIS — Z794 Long term (current) use of insulin: Secondary | ICD-10-CM | POA: Insufficient documentation

## 2022-07-02 DIAGNOSIS — I10 Essential (primary) hypertension: Secondary | ICD-10-CM | POA: Diagnosis not present

## 2022-07-02 DIAGNOSIS — R739 Hyperglycemia, unspecified: Secondary | ICD-10-CM

## 2022-07-02 LAB — BLOOD GAS, VENOUS
Acid-Base Excess: 4.3 mmol/L — ABNORMAL HIGH (ref 0.0–2.0)
Bicarbonate: 31.5 mmol/L — ABNORMAL HIGH (ref 20.0–28.0)
O2 Saturation: 41.1 %
Patient temperature: 37
pCO2, Ven: 57 mmHg (ref 44–60)
pH, Ven: 7.35 (ref 7.25–7.43)
pO2, Ven: <31 mmHg — CL (ref 32–45)

## 2022-07-02 LAB — CBC WITH DIFFERENTIAL/PLATELET
Abs Immature Granulocytes: 0.02 10*3/uL (ref 0.00–0.07)
Basophils Absolute: 0 10*3/uL (ref 0.0–0.1)
Basophils Relative: 0 %
Eosinophils Absolute: 0.2 10*3/uL (ref 0.0–0.5)
Eosinophils Relative: 3 %
HCT: 45.2 % (ref 36.0–46.0)
Hemoglobin: 15.8 g/dL — ABNORMAL HIGH (ref 12.0–15.0)
Immature Granulocytes: 0 %
Lymphocytes Relative: 24 %
Lymphs Abs: 1.5 10*3/uL (ref 0.7–4.0)
MCH: 32.2 pg (ref 26.0–34.0)
MCHC: 35 g/dL (ref 30.0–36.0)
MCV: 92.1 fL (ref 80.0–100.0)
Monocytes Absolute: 0.5 10*3/uL (ref 0.1–1.0)
Monocytes Relative: 8 %
Neutro Abs: 4 10*3/uL (ref 1.7–7.7)
Neutrophils Relative %: 65 %
Platelets: 206 10*3/uL (ref 150–400)
RBC: 4.91 MIL/uL (ref 3.87–5.11)
RDW: 12.2 % (ref 11.5–15.5)
WBC: 6.1 10*3/uL (ref 4.0–10.5)
nRBC: 0 % (ref 0.0–0.2)

## 2022-07-02 LAB — COMPREHENSIVE METABOLIC PANEL
ALT: 82 IU/L — ABNORMAL HIGH (ref 0–32)
ALT: 130 U/L — ABNORMAL HIGH (ref 0–44)
AST: 67 IU/L — ABNORMAL HIGH (ref 0–40)
AST: 102 U/L — ABNORMAL HIGH (ref 15–41)
Albumin/Globulin Ratio: 1.3 (ref 1.2–2.2)
Albumin: 4 g/dL (ref 3.9–4.9)
Albumin: 3.8 g/dL (ref 3.5–5.0)
Alkaline Phosphatase: 323 IU/L — ABNORMAL HIGH (ref 44–121)
Alkaline Phosphatase: 329 U/L — ABNORMAL HIGH (ref 38–126)
Anion gap: 8 (ref 5–15)
BUN/Creatinine Ratio: 19 (ref 9–23)
BUN: 15 mg/dL (ref 6–24)
BUN: 16 mg/dL (ref 6–20)
Bilirubin Total: 0.3 mg/dL (ref 0.0–1.2)
CO2: 22 mmol/L (ref 20–29)
CO2: 27 mmol/L (ref 22–32)
Calcium: 10 mg/dL (ref 8.7–10.2)
Calcium: 10.1 mg/dL (ref 8.9–10.3)
Chloride: 94 mmol/L — ABNORMAL LOW (ref 96–106)
Chloride: 100 mmol/L (ref 98–111)
Creatinine, Ser: 0.81 mg/dL (ref 0.57–1.00)
Creatinine, Ser: 0.85 mg/dL (ref 0.44–1.00)
GFR, Estimated: >60 mL/min (ref >60)
Globulin, Total: 3 g/dL (ref 1.5–4.5)
Glucose, Bld: 466 mg/dL — ABNORMAL HIGH (ref 70–99)
Glucose: 561 mg/dL (ref 70–99)
Potassium: 4.8 mmol/L (ref 3.5–5.2)
Potassium: 4.1 mmol/L (ref 3.5–5.1)
Sodium: 131 mmol/L — ABNORMAL LOW (ref 134–144)
Sodium: 135 mmol/L (ref 135–145)
Total Bilirubin: 0.7 mg/dL (ref 0.3–1.2)
Total Protein: 7 g/dL (ref 6.0–8.5)
Total Protein: 8 g/dL (ref 6.5–8.1)
eGFR: 89 mL/min/{1.73_m2} (ref >59)

## 2022-07-02 LAB — URINALYSIS, ROUTINE W REFLEX MICROSCOPIC
Bacteria, UA: NONE SEEN
Bilirubin Urine: NEGATIVE
Glucose, UA: >=500 mg/dL — AB
Hgb urine dipstick: NEGATIVE
Ketones, ur: NEGATIVE mg/dL
Leukocytes,Ua: NEGATIVE
Nitrite: NEGATIVE
Protein, ur: NEGATIVE mg/dL
Specific Gravity, Urine: 1.035 — ABNORMAL HIGH (ref 1.005–1.030)
pH: 6 (ref 5.0–8.0)

## 2022-07-02 LAB — HEMOGLOBIN A1C
Est. average glucose Bld gHb Est-mCnc: 315 mg/dL
Hgb A1c MFr Bld: 12.6 % — ABNORMAL HIGH (ref 4.8–5.6)

## 2022-07-02 LAB — I-STAT BETA HCG BLOOD, ED (MC, WL, AP ONLY): I-stat hCG, quantitative: <5 m[IU]/mL (ref <5)

## 2022-07-02 LAB — CBG MONITORING, ED
Glucose-Capillary: 419 mg/dL — ABNORMAL HIGH (ref 70–99)
Glucose-Capillary: 399 mg/dL — ABNORMAL HIGH (ref 70–99)
Glucose-Capillary: 259 mg/dL — ABNORMAL HIGH (ref 70–99)

## 2022-07-02 MED ORDER — BD PEN NEEDLE NANO U/F 32G X 4 MM MISC: 1.0000 | Freq: Every day | 2 refills | Status: DC

## 2022-07-02 MED ORDER — INSULIN ASPART 100 UNIT/ML FLEXPEN: 10.0000 [IU] | PEN_INJECTOR | Freq: Three times a day (TID) | SUBCUTANEOUS | 2 refills | Status: DC

## 2022-07-02 MED ORDER — LACTATED RINGERS IV BOLUS
1000.0000 mL | Freq: Once | INTRAVENOUS | Status: AC
  Administered 2022-07-02: 1000 mL via INTRAVENOUS

## 2022-07-02 MED ORDER — INSULIN ASPART 100 UNIT/ML IJ SOLN
5.0000 [IU] | Freq: Once | INTRAMUSCULAR | Status: AC
Start: 2022-07-02 — End: 2022-07-02
  Administered 2022-07-02: 5 [IU] via SUBCUTANEOUS
  Filled 2022-07-02: qty 0.05

## 2022-07-02 NOTE — Progress Notes (Signed)
novolog

## 2022-07-02 NOTE — ED Provider Triage Note (Signed)
Emergency Medicine Provider Triage Evaluation Note  MEARL HAREWOOD , a 51 y.o. female  was evaluated in triage.  Pt complains of hyperglycemia and elevated A1C. The patient has diabetes and has not had treatment in two years. She was seen for a check up at her PCP and did blood work. Her A1C was over 12 and her glucose was over 500. They advised her to come in to lower her sugar. Asymptomatic.   Review of Systems  Positive:  Negative:   Physical Exam  BP (!) 151/84 (BP Location: Right Arm)   Pulse 94   Temp 98.3 F (36.8 C) (Oral)   Resp 18   SpO2 99%  Gen:   Awake, no distress   Resp:  Normal effort  MSK:   Moves extremities without difficulty  Other:  NAD  Medical Decision Making  Medically screening exam initiated at 2:10 PM.  Appropriate orders placed.  BRINA UMEDA was informed that the remainder of the evaluation will be completed by another provider, this initial triage assessment does not replace that evaluation, and the importance of remaining in the ED until their evaluation is complete.  Will order hyperglycemia labs.    Achille Rich, [place] 07/02/22 1412

## 2022-07-02 NOTE — ED Triage Notes (Signed)
Pt reports she was sent by PCP due to glucose of greater than 500 and A1C of greater than 12. Pt denies any symptoms. Pt reports she has not taken insulin in 2 years.

## 2022-07-02 NOTE — Discharge Instructions (Signed)
Take the insulin that your primary doctor prescribed you.   Follow-up with your primary doctor as soon as possible.   Return if you experience severe shortness of breath, increased thirst, increasing urination, or any other concerning symptoms.

## 2022-07-02 NOTE — ED Provider Notes (Addendum)
Bailey Stevenson   CSN: [id number] Arrival date & time: 07/02/22  1305     History  Chief Complaint  Patient presents with   Abnormal Lab    Bailey Stevenson is a 51 y.o. female.  51 year old female with a history of DM 2 presented to the emergency department with elevated blood sugars.  Patient states that she had a lapse in her insurance so went to her primary doctor for the first time on Friday.  She was treated for dental abscess and had lab work drawn.  Her blood sugar was noted to be in the 400s and her primary doctor referred her to the emergency department for additional evaluation.  Says that she was previously on an injectable insulin at night along with metformin but has not been taking these due to issues with her insurance.  Denies any new increased thirst, shortness of breath, increased urinary frequency.  Says that the swelling has gone down from her dental abscess and that she has been compliant with her antibiotics.  Denies any other recent infections.   Abnormal Lab  Past Medical History:  Diagnosis Date   Asthma    Diabetes mellitus    TYPE II   Elevated cholesterol    Essential hypertension, benign 10/13/2017   Pneumonia    Trichimoniasis 05/2017      Home Medications Prior to Admission medications   Medication Sig Start Date End Date Taking? Authorizing Provider  acetaminophen (TYLENOL) 500 MG tablet Take 500-1,000 mg by mouth every 6 (six) hours as needed for mild pain or headache.   Yes [provider]  amoxicillin-clavulanate (AUGMENTIN) 875-125 MG tablet Take 1 tablet by mouth 2 (two) times daily for 7 days. 06/27/22 07/04/22 Yes Banister, Gwenlyn Perking, MD  insulin aspart (NOVOLOG) 100 UNIT/ML FlexPen Inject 10 Units into the skin 3 (three) times daily with meals. Patient not taking: Reported on 07/02/2022 07/02/22   Tysinger, Camelia Eng, PA-C  Insulin Pen Needle (BD PEN NEEDLE NANO U/F) 32G X 4 MM MISC 1  each by Does not apply route at bedtime. 07/02/22   Tysinger, Camelia Eng, PA-C      Allergies    Shellfish allergy and Eggs or egg-derived products    Review of Systems   Review of Systems Negative for dysuria, cough, runny nose or sore throat  Physical Exam Updated Vital Signs BP (!) 146/82 (BP Location: Right Arm)   Pulse 90   Temp 98.2 F (36.8 C) (Oral)   Resp 16   SpO2 98%  Physical Exam Vitals and nursing Stevenson reviewed.  Constitutional:      General: She is not in acute distress.    Appearance: She is well-developed.  HENT:     Head: Normocephalic and atraumatic.     Right Ear: External ear normal.     Left Ear: External ear normal.     Nose: Nose normal.     Mouth/Throat:     Mouth: Mucous membranes are moist.     Pharynx: Oropharynx is clear.     Comments: Poor dentition.  No tenderness to palpation of her teeth.  Small amount of induration on her upper gums without fluctuance noted Eyes:     Extraocular Movements: Extraocular movements intact.     Conjunctiva/sclera: Conjunctivae normal.     Pupils: Pupils are equal, round, and reactive to light.  Cardiovascular:     Rate and Rhythm: Normal rate and regular rhythm.     Heart  sounds: No murmur heard. Pulmonary:     Effort: Pulmonary effort is normal. No respiratory distress.     Breath sounds: Normal breath sounds.  Abdominal:     General: There is no distension.     Palpations: Abdomen is soft. There is no mass.     Tenderness: There is no abdominal tenderness. There is no guarding.  Musculoskeletal:        General: No swelling.     Cervical back: Neck supple.  Skin:    General: Skin is warm and dry.     Capillary Refill: Capillary refill takes less than 2 seconds.  Neurological:     Mental Status: She is alert.  Psychiatric:        Mood and Affect: Mood normal.     ED Results / Procedures / Treatments   Labs (all labs ordered are listed, but only abnormal results are displayed) Labs Reviewed  CBC  WITH DIFFERENTIAL/PLATELET - Abnormal; Notable for the following components:      Result Value   Hemoglobin 15.8 (*)    All other components within normal limits  COMPREHENSIVE METABOLIC PANEL - Abnormal; Notable for the following components:   Glucose, Bld 466 (*)    AST 102 (*)    ALT 130 (*)    Alkaline Phosphatase 329 (*)    All other components within normal limits  URINALYSIS, ROUTINE W REFLEX MICROSCOPIC - Abnormal; Notable for the following components:   Color, Urine STRAW (*)    Specific Gravity, Urine 1.035 (*)    Glucose, UA >=500 (*)    All other components within normal limits  BLOOD GAS, VENOUS - Abnormal; Notable for the following components:   pO2, Ven <31 (*)    Bicarbonate 31.5 (*)    Acid-Base Excess 4.3 (*)    All other components within normal limits  CBG MONITORING, ED - Abnormal; Notable for the following components:   Glucose-Capillary 419 (*)    All other components within normal limits  CBG MONITORING, ED - Abnormal; Notable for the following components:   Glucose-Capillary 399 (*)    All other components within normal limits  CBG MONITORING, ED - Abnormal; Notable for the following components:   Glucose-Capillary 259 (*)    All other components within normal limits  BETA-HYDROXYBUTYRIC ACID  I-STAT BETA HCG BLOOD, ED ([facility], [facility], [facility] ONLY)    EKG None  Radiology No results found.  Procedures Procedures   Medications Ordered in ED Medications  lactated ringers bolus 1,000 mL (0 mLs Intravenous Stopped 07/02/22 1935)  insulin aspart (novoLOG) injection 5 Units (5 Units Subcutaneous Given 07/02/22 1716)    ED Course/ Medical Decision Making/ A&P Clinical Course as of 07/02/22 2305  Sat Jul 02, 2022  1925 Repeat blood sugar 259.  Patient not in DKA or HSS.  Will discharge with PCP follow-up.  She was notified by her primary doctor that he has already prescribed her NovoLog for home.  Reports that she has equipment at home to take her blood sugar.  [RP]    Clinical Course User Index [RP] Rondel Baton, MD                           Medical Decision Making 51 year old female with a history of DM 2 presented to the emergency department with elevated blood sugars.  Labs were obtained patient was not in DKA or HHS.  Patient stated that she was having increasing urinary  frequency but urinalysis was not consistent with UTI.  Appears that her hyperglycemia is due to the fact that she was out of her medications.  Did have dental infection that appears to be improved today.  Patient was given fluids along with aspart and her blood sugar improved to 259.  She was then discharged home with PCP follow-up.  Reports that she has already been prescribed NovoLog and has appointment at home to take her blood sugar and inject her insulin.   Risk Prescription drug management.   Final Clinical Impression(s) / ED Diagnoses Final diagnoses:  Hyperglycemia  Type 2 diabetes mellitus with other specified complication, without long-term current use of insulin [facility])    Rx / DC Orders ED Discharge Orders     None         Rondel Baton, MD 07/02/22 2305    Rondel Baton, MD 07/02/22 2306

## 2022-07-08 ENCOUNTER — Ambulatory Visit: Payer: 59 | Admitting: Medical

## 2022-07-08 VITALS — BP 122/70 | HR 93 | Ht 62.0 in | Wt 126.8 lb

## 2022-07-08 DIAGNOSIS — R7989 Other specified abnormal findings of blood chemistry: Secondary | ICD-10-CM | POA: Diagnosis not present

## 2022-07-08 DIAGNOSIS — Z609 Problem related to social environment, unspecified: Secondary | ICD-10-CM

## 2022-07-08 DIAGNOSIS — Z6379 Other stressful life events affecting family and household: Secondary | ICD-10-CM | POA: Diagnosis not present

## 2022-07-08 DIAGNOSIS — R748 Abnormal levels of other serum enzymes: Secondary | ICD-10-CM

## 2022-07-08 DIAGNOSIS — K029 Dental caries, unspecified: Secondary | ICD-10-CM

## 2022-07-08 DIAGNOSIS — E785 Hyperlipidemia, unspecified: Secondary | ICD-10-CM

## 2022-07-08 MED ORDER — ROSUVASTATIN CALCIUM 10 MG PO TABS: 10.0000 mg | ORAL_TABLET | Freq: Every day | ORAL | 3 refills | Status: DC

## 2022-07-08 MED ORDER — METFORMIN HCL ER 500 MG PO TB24: 1000.0000 mg | ORAL_TABLET | Freq: Every day | ORAL | 1 refills | Status: DC

## 2022-07-08 MED ORDER — GLUCOSE METER TEST VI STRP: 1.0000 | ORAL_STRIP | 5 refills | Status: AC | PRN

## 2022-07-08 NOTE — Progress Notes (Signed)
Subjective:  Bailey Stevenson is a 51 y.o. female who presents for Chief Complaint  Patient presents with   Hospitalization Follow-up    Hospital follow-up. Ranging 147- 300s.  Working on getting sugars down, stressing makes them go higher Discuss U/S from 06/2020.      Here for hospital follow-up.  I saw her about a week ago on July 01, 2022.  At that time she had been lost to follow-up for about a year and a half due to insurance changes at work.  She had been out of her medications including diabetes medicines for about a year and a half.  Only got labs back last week her blood sugars were over 500.  She was advised to go to the emergency department which she did.  After some initial insulin her sugars came down into a more acceptable range.  She was not admitted to the hospital.  She was discharged on mealtime insulin which she is taking.  She is doing NovoLog 10 units 3 times a day with meals.  She is checking her blood sugars with her new glucometer.  Her numbers have ranged from some low her morning reading such as this morning at 147.  The highest reading recently been 333.  She has several readings in the 200 range and a few readings in the 100s.  The 147 was not fasting this morning.  The 333 was after a meal.  She feels okay today.  No polyuria, no polydipsia, no blurred vision.  Her last alcohol use was age 63.  She has an appointment coming up with her dentist given the tooth today  Her labs at the hospital showed elevated liver test.  She denies any jaundice pain nausea vomiting or stool changes.  She is aware of the liver test but does not necessarily want to go do any more imaging right now.  She wants to get her sugars under better control before tackling some other big issue.  No other aggravating or relieving factors.    No other c/o.  The following portions of the patient's history were reviewed and updated as appropriate: allergies, current medications, past family  history, past medical history, past social history, past surgical history and problem list.  ROS Otherwise as in subjective above  Objective: BP 122/70   Pulse 93   Ht 5\' 2"  (1.575 m)   Wt 126 lb 12.8 oz (57.5 kg)   BMI 23.19 kg/m   General appearance: alert, no distress, well developed, well nourished oralMMM Neck: supple, no lymphadenopathy, no thyromegaly, no masses Heart: RRR, normal S1, S2, no murmurs Lungs: CTA bilaterally, no wheezes, rhonchi, or rales Abdomen: +bs, soft, non tender, non distended, no masses, no hepatomegaly, no splenomegaly Pulses: 2+ radial pulses, 2+ pedal pulses, normal cap refill Ext: no edema   Assessment: Encounter Diagnoses  Name Primary?   Elevated LFTs Yes   Alkaline phosphatase elevation    Tooth decay    Other stressful life events affecting family and household    Hyperlipidemia, unspecified hyperlipidemia type    High risk social situation    Dyslipidemia      Plan: I reviewed her emergency department notes from 07/02/2022.  I reviewed labs and ultrasound of abdomen.  Her blood sugars are certainly coming down.  She will continue NovoLog insulin 10 units 3 times a day with meals.  We will add back metformin at this time.  She tolerated this in the past.  She will continue glucose monitoring.  We discussed checking fasting blood sugars and some nighttime readings.  We will add back statin  We discussed her elevated liver test.  We discussed the ultrasound results and radiology recommendation to get an MRI abdomen.  She declines going forward with imaging at this point.  She was agreeable to let me check some labs again today but she does not want to necessarily get overwhelmed with another big issue.  We discussed the possibility of significant liver issues.  Nevertheless we will start with some labs for recheck today as below   [name] was seen today for hospitalization follow-up.  Diagnoses and all orders for this  visit:  Elevated LFTs -     Hepatic function panel -     Hepatitis B surface antigen -     Hepatitis C antibody -     Iron -     Lipase  Alkaline phosphatase elevation -     Hepatic function panel -     Hepatitis B surface antigen -     Hepatitis C antibody -     Iron -     Lipase  Tooth decay  Other stressful life events affecting family and household  Hyperlipidemia, unspecified hyperlipidemia type  High risk social situation  Dyslipidemia  Other orders -     glucose blood (GLUCOSE METER TEST) test strip; 1 each by Other route as needed for other. One Touch Verio, checks 1-2 times daily -     rosuvastatin (CRESTOR) 10 MG tablet; Take 1 tablet (10 mg total) by mouth daily. -     metFORMIN (GLUCOPHAGE-XR) 500 MG 24 hr tablet; Take 2 tablets (1,000 mg total) by mouth daily with breakfast.    Follow up: 73mo

## 2022-07-09 LAB — LIPASE: Lipase: 78 U/L — ABNORMAL HIGH (ref 14–72)

## 2022-07-09 LAB — HEPATIC FUNCTION PANEL
ALT: 55 IU/L — ABNORMAL HIGH (ref 0–32)
AST: 35 IU/L (ref 0–40)
Albumin: 4 g/dL (ref 3.9–4.9)
Alkaline Phosphatase: 247 IU/L — ABNORMAL HIGH (ref 44–121)
Bilirubin Total: <0.2 mg/dL (ref 0.0–1.2)
Bilirubin, Direct: 0.13 mg/dL (ref 0.00–0.40)
Total Protein: 6.6 g/dL (ref 6.0–8.5)

## 2022-07-09 LAB — HEPATITIS C ANTIBODY: Hep C Virus Ab: NONREACTIVE

## 2022-07-09 LAB — IRON: Iron: 79 ug/dL (ref 27–159)

## 2022-07-09 LAB — HEPATITIS B SURFACE ANTIGEN: Hepatitis B Surface Ag: NEGATIVE

## 2022-07-27 ENCOUNTER — Ambulatory Visit: Payer: 59 | Admitting: Medical

## 2022-07-27 VITALS — BP 124/70 | HR 58 | Wt 128.2 lb

## 2022-07-27 DIAGNOSIS — I10 Essential (primary) hypertension: Secondary | ICD-10-CM | POA: Diagnosis not present

## 2022-07-27 DIAGNOSIS — E11649 Type 2 diabetes mellitus with hypoglycemia without coma: Secondary | ICD-10-CM | POA: Diagnosis not present

## 2022-07-27 DIAGNOSIS — R748 Abnormal levels of other serum enzymes: Secondary | ICD-10-CM | POA: Diagnosis not present

## 2022-07-27 DIAGNOSIS — E785 Hyperlipidemia, unspecified: Secondary | ICD-10-CM

## 2022-07-27 DIAGNOSIS — R7989 Other specified abnormal findings of blood chemistry: Secondary | ICD-10-CM

## 2022-07-27 NOTE — Patient Instructions (Signed)
Begin over the counter vitamin D 1000 u supplement  Continue Novolog insulin three times daily with meals.  Consider increasing lunch dose to 12u and keeping the morning and dinner at 11u.  Or you could increase to 12 u three times daily.     We want to avoid low sugars such as 80 or lower.  If any low readings soon, call back.  We will call with the other lab result.  We may end up recommending referral to gastro specialist or refer for abdominal imaging such as CT scan.  Continue rest of medications as usual

## 2022-07-27 NOTE — Progress Notes (Signed)
Subjective:  Bailey Stevenson is a 51 y.o. female who presents for Chief Complaint  Patient presents with   follow-up    Follow-up on labs. Declines flu shot today     Here to follow up from recent abnormal labs.   At her recent visit liver tests had improved but ALP test remained high.  She currently denies any abdominal pain or skin changes.   She is taking Novolog 11 units TID, metformin.  Sugars have been gradually improving per her notes today from high 200's to as low as 98 recently.  I reviewed her recent readings confirming this.    She is compliant with metformin and statin.  She has hx/o cholecystectomy in past.  Unfortunately her father passed away yesterday of lung cancer.  (I expressed my sympathy for her loss)  No other aggravating or relieving factors.    No other c/o.  Past Medical History:  Diagnosis Date   Asthma    Diabetes mellitus    TYPE II   Elevated cholesterol    Essential hypertension, benign 10/13/2017   Pneumonia    Trichimoniasis 05/2017   Current Outpatient Medications on File Prior to Visit  Medication Sig Dispense Refill   insulin aspart (NOVOLOG) 100 UNIT/ML FlexPen Inject 10 Units into the skin 3 (three) times daily with meals. 15 mL 2   metFORMIN (GLUCOPHAGE-XR) 500 MG 24 hr tablet Take 2 tablets (1,000 mg total) by mouth daily with breakfast. 180 tablet 1   rosuvastatin (CRESTOR) 10 MG tablet Take 1 tablet (10 mg total) by mouth daily. 90 tablet 3   glucose blood (GLUCOSE METER TEST) test strip 1 each by Other route as needed for other. One Touch Verio, checks 1-2 times daily 100 each 5   Insulin Pen Needle (BD PEN NEEDLE NANO U/F) 32G X 4 MM MISC 1 each by Does not apply route at bedtime. 100 each 2   No current facility-administered medications on file prior to visit.     The following portions of the patient's history were reviewed and updated as appropriate: allergies, current medications, past family history, past medical history,  past social history, past surgical history and problem list.  ROS Otherwise as in subjective above  Objective: BP 124/70   Pulse (!) 58   Wt 128 lb 3.2 oz (58.2 kg)   BMI 23.45 kg/m   BP Readings from Last 3 Encounters:  07/27/22 124/70  07/08/22 122/70  07/02/22 (!) 146/82   Wt Readings from Last 3 Encounters:  07/27/22 128 lb 3.2 oz (58.2 kg)  07/08/22 126 lb 12.8 oz (57.5 kg)  07/01/22 124 lb (56.2 kg)    General appearance: alert, no distress, well developed, well nourished Otherwise not examined today    Assessment: Encounter Diagnoses  Name Primary?   Alkaline phosphatase elevation Yes   Uncontrolled type 2 diabetes mellitus with hypoglycemia, unspecified hypoglycemia coma status (HCC)    Essential hypertension, benign    Dyslipidemia    Elevated LFTs      Plan: I reviewed back over her recent hospital visit and recent labs.  Given the elevated alkaline phosphatase we are going to recheck isoenzymes.  We discussed that she may ultimately need abdominal scan or referral to gastroenterology.  We discussed possible causes of elevated ALP  Diabetes-fortunately her numbers are starting to improve from where she was uncontrolled weeks ago.  See prior recent visit notes as well.  She was lost to follow-up for about a year and a half  and then came in about a month ago with blood sugars over 500.  She was seen increasingly improving numbers.  She is compliant with meal time insulin, crestor and metfomrin.  We discussed the recommendations as below  Patient Instructions  Begin over the counter vitamin D 1000 u supplement  Continue Novolog insulin three times daily with meals.  Consider increasing lunch dose to 12u and keeping the morning and dinner at 11u.  Or you could increase to 12 u three times daily.     We want to avoid low sugars such as 80 or lower.  If any low readings soon, call back.  We will call with the other lab result.  We may end up  recommending referral to gastro specialist or refer for abdominal imaging such as CT scan.  Continue rest of medications as usual   Bailey Stevenson was seen today for follow-up.  Diagnoses and all orders for this visit:  Alkaline phosphatase elevation -     Alkaline Phosphatase, Isoenzymes  Uncontrolled type 2 diabetes mellitus with hypoglycemia, unspecified hypoglycemia coma status (HCC)  Essential hypertension, benign  Dyslipidemia  Elevated LFTs   Follow up: pending labs

## 2022-07-30 LAB — ALKALINE PHOSPHATASE, ISOENZYMES
Alkaline Phosphatase: 176 IU/L — ABNORMAL HIGH (ref 44–121)
BONE FRACTION: 22 % (ref 14–68)
INTESTINAL FRAC.: 20 % — ABNORMAL HIGH (ref 0–18)
LIVER FRACTION: 58 % (ref 18–85)

## 2022-08-01 ENCOUNTER — Other Ambulatory Visit: Payer: Self-pay | Admitting: Medical

## 2022-08-01 DIAGNOSIS — R7989 Other specified abnormal findings of blood chemistry: Secondary | ICD-10-CM

## 2022-08-01 DIAGNOSIS — Z1211 Encounter for screening for malignant neoplasm of colon: Secondary | ICD-10-CM

## 2022-08-01 DIAGNOSIS — R748 Abnormal levels of other serum enzymes: Secondary | ICD-10-CM

## 2022-09-13 ENCOUNTER — Encounter: Payer: Self-pay | Admitting: Medical

## 2022-10-13 ENCOUNTER — Encounter: Payer: Self-pay | Admitting: Medical

## 2023-09-08 ENCOUNTER — Other Ambulatory Visit: Payer: Self-pay | Admitting: Medical

## 2023-09-08 DIAGNOSIS — Z1212 Encounter for screening for malignant neoplasm of rectum: Secondary | ICD-10-CM

## 2023-09-08 DIAGNOSIS — Z1211 Encounter for screening for malignant neoplasm of colon: Secondary | ICD-10-CM

## 2023-10-22 LAB — COLOGUARD: COLOGUARD: NEGATIVE

## 2023-10-23 NOTE — Progress Notes (Signed)
Your Cologuard screening for colon cancer was negative.  This indicates a lower likelihood that colorectal cancer is present.   Lets plan to repeat this in 3 years.  However, if you develop bowel changes, blood in stool, unexpected weight loss, or other new bowel changes, then recheck.

## 2023-10-24 ENCOUNTER — Encounter: Payer: Self-pay | Admitting: Internal Medicine

## 2024-02-25 ENCOUNTER — Encounter (HOSPITAL_COMMUNITY): Payer: Self-pay | Admitting: Emergency Medicine

## 2024-02-25 ENCOUNTER — Emergency Department (HOSPITAL_COMMUNITY)
Admission: EM | Admit: 2024-02-25 | Discharge: 2024-02-26 | Disposition: A | Discharge status: Home / Self Care | Attending: Emergency Medicine | Admitting: Emergency Medicine

## 2024-02-25 ENCOUNTER — Emergency Department (HOSPITAL_COMMUNITY)

## 2024-02-25 ENCOUNTER — Other Ambulatory Visit: Payer: Self-pay

## 2024-02-25 DIAGNOSIS — M25551 Pain in right hip: Secondary | ICD-10-CM | POA: Diagnosis not present

## 2024-02-25 DIAGNOSIS — Z7984 Long term (current) use of oral hypoglycemic drugs: Secondary | ICD-10-CM | POA: Insufficient documentation

## 2024-02-25 DIAGNOSIS — M7918 Myalgia, other site: Secondary | ICD-10-CM

## 2024-02-25 DIAGNOSIS — E1165 Type 2 diabetes mellitus with hyperglycemia: Secondary | ICD-10-CM | POA: Diagnosis not present

## 2024-02-25 DIAGNOSIS — F329 Major depressive disorder, single episode, unspecified: Secondary | ICD-10-CM | POA: Diagnosis not present

## 2024-02-25 DIAGNOSIS — J45909 Unspecified asthma, uncomplicated: Secondary | ICD-10-CM | POA: Diagnosis not present

## 2024-02-25 DIAGNOSIS — M79604 Pain in right leg: Secondary | ICD-10-CM | POA: Insufficient documentation

## 2024-02-25 DIAGNOSIS — R739 Hyperglycemia, unspecified: Secondary | ICD-10-CM

## 2024-02-25 DIAGNOSIS — Z794 Long term (current) use of insulin: Secondary | ICD-10-CM | POA: Diagnosis not present

## 2024-02-25 LAB — I-STAT CHEM 8, ED
BUN: 12 mg/dL (ref 6–20)
Calcium, Ion: 1.21 mmol/L (ref 1.15–1.40)
Chloride: 102 mmol/L (ref 98–111)
Creatinine, Ser: 0.8 mg/dL (ref 0.44–1.00)
Glucose, Bld: 306 mg/dL — ABNORMAL HIGH (ref 70–99)
HCT: 46 % (ref 36.0–46.0)
Hemoglobin: 15.6 g/dL — ABNORMAL HIGH (ref 12.0–15.0)
Potassium: 5 mmol/L (ref 3.5–5.1)
Sodium: 137 mmol/L (ref 135–145)
TCO2: 27 mmol/L (ref 22–32)

## 2024-02-25 LAB — CBG MONITORING, ED: Glucose-Capillary: 353 mg/dL — ABNORMAL HIGH (ref 70–99)

## 2024-02-25 MED ORDER — LIDOCAINE 5 % EX PTCH
1.0000 | MEDICATED_PATCH | CUTANEOUS | Status: DC
Start: 2024-02-25 — End: 2024-02-26
  Administered 2024-02-25: 1 via TRANSDERMAL
  Filled 2024-02-25: qty 1

## 2024-02-25 MED ORDER — KETOROLAC TROMETHAMINE 15 MG/ML IJ SOLN
15.0000 mg | Freq: Once | INTRAMUSCULAR | Status: AC
  Administered 2024-02-25: 15 mg via INTRAMUSCULAR
  Filled 2024-02-25: qty 1

## 2024-02-25 MED ORDER — SODIUM CHLORIDE 0.9 % IV BOLUS
1000.0000 mL | Freq: Once | INTRAVENOUS | Status: AC
  Administered 2024-02-25: 1000 mL via INTRAVENOUS

## 2024-02-25 NOTE — ED Provider Notes (Incomplete)
 [place] EMERGENCY DEPARTMENT AT [facility] Provider Note   CSN: [id number] Arrival date & time: 02/25/24  2004     History {Add pertinent medical, surgical, social history, OB history to HPI:1} Chief Complaint  Patient presents with  . Hip Pain    Bailey Stevenson is a 53 y.o. female with past medical history of insulin-dependent type 2 diabetes, asthma, HLD, depression presents to emergency department for evaluation of right buttocks pain that started 3 days ago.  She reports that she was walking and stepped on something on Wednesday and felt a bad cramp in her hip, buttocks that radiates down into the back of her thigh.  She also endorses that she has not been checking her sugar nor taking her insulin or metformin for several days secondary to depression.  She denies polydipsia, polyuria, decreased urine output, abdominal pain, nausea vomiting. She denies SI, HI, self-injury.   Hip Pain Pertinent negatives include no chest pain, no abdominal pain, no headaches and no shortness of breath.      Home Medications Prior to Admission medications   Medication Sig Start Date End Date Taking? Authorizing Provider  glucose blood (GLUCOSE METER TEST) test strip 1 each by Other route as needed for other. One Touch Verio, checks 1-2 times daily Patient not taking: Reported on 02/25/2024 07/08/22   Tysinger, Kermit Balo, PA-C  insulin aspart (NOVOLOG) 100 UNIT/ML FlexPen Inject 10 Units into the skin 3 (three) times daily with meals. Patient not taking: Reported on 02/25/2024 07/02/22   Tysinger, Kermit Balo, PA-C  Insulin Pen Needle (BD PEN NEEDLE NANO U/F) 32G X 4 MM MISC 1 each by Does not apply route at bedtime. Patient not taking: Reported on 02/25/2024 07/02/22   Tysinger, Kermit Balo, PA-C  metFORMIN (GLUCOPHAGE-XR) 500 MG 24 hr tablet Take 2 tablets (1,000 mg total) by mouth daily with breakfast. Patient not taking: Reported on 02/25/2024 07/08/22   Tysinger, Kermit Balo, PA-C  rosuvastatin  (CRESTOR) 10 MG tablet Take 1 tablet (10 mg total) by mouth daily. Patient not taking: Reported on 02/25/2024 07/08/22   Jac Canavan, PA-C      Allergies    Shellfish allergy and Egg-derived products    Review of Systems   Review of Systems  Constitutional:  Negative for chills, fatigue and fever.  Respiratory:  Negative for cough, chest tightness, shortness of breath and wheezing.   Cardiovascular:  Negative for chest pain and palpitations.  Gastrointestinal:  Negative for abdominal pain, constipation, diarrhea, nausea and vomiting.  Neurological:  Negative for dizziness, seizures, weakness, light-headedness, numbness and headaches.    Physical Exam Updated Vital Signs BP (!) 153/107 (BP Location: Right Arm)   Pulse 88   Temp 98.1 F (36.7 C) (Oral)   Resp 18   SpO2 99%  Physical Exam Vitals and nursing note reviewed.  Constitutional:      General: She is not in acute distress.    Appearance: Normal appearance.  HENT:     Head: Normocephalic and atraumatic.  Eyes:     Conjunctiva/sclera: Conjunctivae normal.  Cardiovascular:     Rate and Rhythm: Normal rate.     Pulses:          Dorsalis pedis pulses are 2+ on the right side and 2+ on the left side.  Pulmonary:     Effort: Pulmonary effort is normal. No respiratory distress.  Musculoskeletal:     Right lower leg: No edema.     Left lower leg: No  edema.     Comments: TTP of right buttocks and right thigh with deep palpation. Ambulates with limp 2/2 pain of RLE but is able to WB fully on RLE. Full flexion and extension of hips, knees, and ankle bilaterally  No swelling of BLE.  No calf tenderness.  Negative Homans' sign.  No overlying skin changes, warmth nor erythema. Pelvis stable  Skin:    General: Skin is warm.     Capillary Refill: Capillary refill takes less than 2 seconds.     Coloration: Skin is not jaundiced or pale.  Neurological:     Mental Status: She is alert and oriented to person, place, and time.  Mental status is at baseline.    ED Results / Procedures / Treatments   Labs (all labs ordered are listed, but only abnormal results are displayed) Labs Reviewed  CBG MONITORING, ED - Abnormal; Notable for the following components:      Result Value   Glucose-Capillary 353 (*)    All other components within normal limits  I-STAT CHEM 8, ED - Abnormal; Notable for the following components:   Glucose, Bld 306 (*)    Hemoglobin 15.6 (*)    All other components within normal limits  COMPREHENSIVE METABOLIC PANEL    EKG None  Radiology DG Hip Unilat W or Wo Pelvis 2-3 Views Right Result Date: 02/25/2024 CLINICAL DATA:  Right-sided hip pain common no known injury, initial encounter EXAM: DG HIP (WITH OR WITHOUT PELVIS) 3V RIGHT COMPARISON:  None Available. FINDINGS: Pelvic ring is intact. IUD is noted in place. No acute fracture or dislocation is noted. No soft tissue changes are seen. IMPRESSION: No acute abnormality noted. Electronically Signed   By: Alcide Clever M.D.   On: 02/25/2024 20:50    Procedures Procedures  {Document cardiac monitor, telemetry assessment procedure when appropriate:1}  Medications Ordered in ED Medications  lidocaine (LIDODERM) 5 % 1 patch (1 patch Transdermal Patch Applied 02/25/24 2053)  ketorolac (TORADOL) 15 MG/ML injection 15 mg (15 mg Intramuscular Given 02/25/24 2051)  sodium chloride 0.9 % bolus 1,000 mL (1,000 mLs Intravenous New Bag/Given 02/25/24 2135)    ED Course/ Medical Decision Making/ A&P   {   Click here for ABCD2, HEART and other calculatorsREFRESH Note before signing :1}                              Medical Decision Making Amount and/or Complexity of Data Reviewed Labs: ordered. Radiology: ordered.  Risk Prescription drug management.   Patient presents to the ED for concern of right buttocks and thigh pain, this involves an extensive number of treatment options, and is a complaint that carries with it a high risk of  complications and morbidity.  The differential diagnosis includes sciatica, muscle strain, fracture, DVT, cellulitis   Co morbidities that complicate the patient evaluation  See HPI   Additional history obtained:  Additional history obtained from Nursing and Outside Medical Records   External records from outside source obtained and reviewed including  Triage RN note Medication list   Lab Tests:  I Ordered, and personally interpreted labs.  The pertinent results include:   CBG 353 CMP pending   Imaging Studies ordered:  I ordered imaging studies including left hip, pelvis XR  I independently visualized and interpreted imaging which showed no acute abnormality I agree with the radiologist interpretation    Medicines ordered and prescription drug management:  I ordered medication  including toradol, lidocaine patched  for sciatica  Reevaluation of the patient after these medicines showed that the patient improved I have reviewed the patients home medicines and have made adjustments as needed    Problem List / ED Course:  Depression No SI, HI, nor signs of self injury Will provide outpatient resources and can f/u with PCP Hyperglycemia Likely 2/2 not being compliant with metformin and insulin CMP is pending to ensure no DKA.  I did provide 1 L NS for hyperglycemia I discussed importance of compliance with insulin and metformin and consistent CBG checks Right leg pain Right buttocks pain 2/2 to sciatica vs muscle strain Right hip/pelvis XR negative for acute osseous abnormality Low suspicion for femur fx as she is able to easily bear weight Low suspicion for cellulitis with no infectious signs on PE Ordered DVT US to r/o DVT although my suspicion is low. As it is midnight and study should occur within next 12 hrs, do not think lovenox is necessary prior to official read If [place] neg, will have patient follow up with PCP   Reevaluation:  After the interventions  noted above, I reevaluated the patient and found that they have :improved   Social Determinants of Health:  Has PCP   Dispostion:  After consideration of the diagnostic results and the patients response to treatment, I feel that the patent would benefit from outpatient management.    Final Clinical Impression(s) / ED Diagnoses Final diagnoses:  Right leg pain  Hyperglycemia    Rx / DC Orders ED Discharge Orders     None

## 2024-02-25 NOTE — ED Triage Notes (Signed)
 Pt reports right sided hip pain that radiates down into leg.

## 2024-02-25 NOTE — ED Provider Notes (Signed)
 [place] EMERGENCY DEPARTMENT AT [facility] Provider Note   CSN: [id number] Arrival date & time: 02/25/24  2004     History {Add pertinent medical, surgical, social history, OB history to HPI:1} Chief Complaint  Patient presents with   Hip Pain    Bailey Stevenson is a 53 y.o. female with past medical history of insulin-dependent type 2 diabetes, asthma, HLD, depression presents to emergency department for evaluation of right buttocks pain that started 3 days ago.  She reports that she was walking and stepped on something on Wednesday and felt a bad cramp in her hip, buttocks that radiates down into the back of her thigh.  She also endorses that she has not been checking her sugar nor taking her insulin or metformin secondary to depression.  She denies SI, HI, self-injury.   Hip Pain Pertinent negatives include no chest pain, no abdominal pain, no headaches and no shortness of breath.      Home Medications Prior to Admission medications   Medication Sig Start Date End Date Taking? Authorizing Provider  glucose blood (GLUCOSE METER TEST) test strip 1 each by Other route as needed for other. One Touch Verio, checks 1-2 times daily 07/08/22   Tysinger, Kermit Balo, PA-C  insulin aspart (NOVOLOG) 100 UNIT/ML FlexPen Inject 10 Units into the skin 3 (three) times daily with meals. 07/02/22   Tysinger, Kermit Balo, PA-C  Insulin Pen Needle (BD PEN NEEDLE NANO U/F) 32G X 4 MM MISC 1 each by Does not apply route at bedtime. 07/02/22   Tysinger, Kermit Balo, PA-C  metFORMIN (GLUCOPHAGE-XR) 500 MG 24 hr tablet Take 2 tablets (1,000 mg total) by mouth daily with breakfast. 07/08/22   Tysinger, Kermit Balo, PA-C  rosuvastatin (CRESTOR) 10 MG tablet Take 1 tablet (10 mg total) by mouth daily. 07/08/22   Tysinger, Kermit Balo, PA-C      Allergies    Shellfish allergy and Egg-derived products    Review of Systems   Review of Systems  Constitutional:  Negative for chills, fatigue and fever.  Respiratory:   Negative for cough, chest tightness, shortness of breath and wheezing.   Cardiovascular:  Negative for chest pain and palpitations.  Gastrointestinal:  Negative for abdominal pain, constipation, diarrhea, nausea and vomiting.  Neurological:  Negative for dizziness, seizures, weakness, light-headedness, numbness and headaches.    Physical Exam Updated Vital Signs BP (!) 164/77   Pulse 100   Temp 98.2 F (36.8 C) (Oral)   Resp 16   SpO2 99%  Physical Exam  ED Results / Procedures / Treatments   Labs (all labs ordered are listed, but only abnormal results are displayed) Labs Reviewed  CBG MONITORING, ED - Abnormal; Notable for the following components:      Result Value   Glucose-Capillary 353 (*)    All other components within normal limits  I-STAT CHEM 8, ED - Abnormal; Notable for the following components:   Glucose, Bld 306 (*)    Hemoglobin 15.6 (*)    All other components within normal limits  COMPREHENSIVE METABOLIC PANEL    EKG None  Radiology DG Hip Unilat W or Wo Pelvis 2-3 Views Right Result Date: 02/25/2024 CLINICAL DATA:  Right-sided hip pain common no known injury, initial encounter EXAM: DG HIP (WITH OR WITHOUT PELVIS) 3V RIGHT COMPARISON:  None Available. FINDINGS: Pelvic ring is intact. IUD is noted in place. No acute fracture or dislocation is noted. No soft tissue changes are seen. IMPRESSION: No acute abnormality noted.  Electronically Signed   By: Alcide Clever M.D.   On: 02/25/2024 20:50    Procedures Procedures  {Document cardiac monitor, telemetry assessment procedure when appropriate:1}  Medications Ordered in ED Medications  lidocaine (LIDODERM) 5 % 1 patch (1 patch Transdermal Patch Applied 02/25/24 2053)  ketorolac (TORADOL) 15 MG/ML injection 15 mg (15 mg Intramuscular Given 02/25/24 2051)  sodium chloride 0.9 % bolus 1,000 mL (1,000 mLs Intravenous New Bag/Given 02/25/24 2135)    ED Course/ Medical Decision Making/ A&P   {   Click here for  ABCD2, HEART and other calculatorsREFRESH Note before signing :1}                              Medical Decision Making Amount and/or Complexity of Data Reviewed Radiology: ordered.  Risk Prescription drug management.   Patient presents to the ED for concern of right buttocks and thigh pain, this involves an extensive number of treatment options, and is a complaint that carries with it a high risk of complications and morbidity.  The differential diagnosis includes ***   Co morbidities that complicate the patient evaluation  ***   Additional history obtained:  Additional history obtained from Nursing and Outside Medical Records   External records from outside source obtained and reviewed including  Triage RN note Medication list   Lab Tests:  I Ordered, and personally interpreted labs.  The pertinent results include:      Imaging Studies ordered:  I ordered imaging studies including left hip, pelvis XR  I independently visualized and interpreted imaging which showed no acute abnormality I agree with the radiologist interpretation    Medicines ordered and prescription drug management:  I ordered medication including toradol, lidocaine patched  for sciatica  Reevaluation of the patient after these medicines showed that the patient improved I have reviewed the patients home medicines and have made adjustments as needed    Problem List / ED Course:  Depression No SI, HI, nor signs of self injury Will provide outpatient resources and can f/u with PCP Hyperglycemia Likely 2/2 not being compliant with metformin and insulin No anion gap - no DKA Sciatica Right buttocks pain likely 2/2 to sciatica with radiation of pain to back of leg. +SLR Right hip/pelvis XR negative for acute osseous abnormality Will have patient follow up with PCP   Reevaluation:  After the interventions noted above, I reevaluated the patient and found that they have :improved   Social  Determinants of Health:  Has PCP   Dispostion:  After consideration of the diagnostic results and the patients response to treatment, I feel that the patent would benefit from outpatient management.    Final Clinical Impression(s) / ED Diagnoses Final diagnoses:  None    Rx / DC Orders ED Discharge Orders     None

## 2024-02-26 ENCOUNTER — Ambulatory Visit (HOSPITAL_BASED_OUTPATIENT_CLINIC_OR_DEPARTMENT_OTHER)
Admission: RE | Admit: 2024-02-26 | Discharge: 2024-02-26 | Disposition: A | Discharge status: Home / Self Care | Source: Ambulatory Visit | Attending: Emergency Medicine | Admitting: Emergency Medicine

## 2024-02-26 DIAGNOSIS — M7989 Other specified soft tissue disorders: Secondary | ICD-10-CM | POA: Diagnosis not present

## 2024-02-26 LAB — COMPREHENSIVE METABOLIC PANEL
ALT: 48 U/L — ABNORMAL HIGH (ref 0–44)
AST: 28 U/L (ref 15–41)
Albumin: 3.8 g/dL (ref 3.5–5.0)
Alkaline Phosphatase: 156 U/L — ABNORMAL HIGH (ref 38–126)
Anion gap: 9 (ref 5–15)
BUN: 10 mg/dL (ref 6–20)
CO2: 22 mmol/L (ref 22–32)
Calcium: 8.8 mg/dL — ABNORMAL LOW (ref 8.9–10.3)
Chloride: 105 mmol/L (ref 98–111)
Creatinine, Ser: 0.63 mg/dL (ref 0.44–1.00)
GFR, Estimated: >60 mL/min (ref >60)
Glucose, Bld: 223 mg/dL — ABNORMAL HIGH (ref 70–99)
Potassium: 3.7 mmol/L (ref 3.5–5.1)
Sodium: 136 mmol/L (ref 135–145)
Total Bilirubin: 0.7 mg/dL (ref 0.0–1.2)
Total Protein: 7.5 g/dL (ref 6.5–8.1)

## 2024-02-26 MED ORDER — METHOCARBAMOL 500 MG PO TABS
500.0000 mg | ORAL_TABLET | Freq: Once | ORAL | Status: AC
Start: 2024-02-26 — End: 2024-02-26
  Administered 2024-02-26: 500 mg via ORAL
  Filled 2024-02-26: qty 1

## 2024-02-26 NOTE — Discharge Instructions (Addendum)
 Thank for letting us evaluate you today.  Your sugar was mildly elevated at 360.  I provided you with some fluids here to decrease your sugar.  Please make sure to do routine sugar checks at home and be compliant with metformin and insulin as discussed  Your x-ray of your right hip and pelvis were negative for fracture.  I have set up an appointment for you to obtain DVT study to rule out a blood clot in the leg.

## 2024-03-01 ENCOUNTER — Ambulatory Visit: Admitting: Medical

## 2024-03-01 ENCOUNTER — Other Ambulatory Visit: Payer: Self-pay | Admitting: Medical

## 2024-03-01 VITALS — BP 136/74 | HR 90 | Ht 62.0 in | Wt 121.8 lb

## 2024-03-01 DIAGNOSIS — E785 Hyperlipidemia, unspecified: Secondary | ICD-10-CM

## 2024-03-01 DIAGNOSIS — E1165 Type 2 diabetes mellitus with hyperglycemia: Secondary | ICD-10-CM | POA: Diagnosis not present

## 2024-03-01 DIAGNOSIS — Z91199 Patient's noncompliance with other medical treatment and regimen due to unspecified reason: Secondary | ICD-10-CM | POA: Diagnosis not present

## 2024-03-01 DIAGNOSIS — R4589 Other symptoms and signs involving emotional state: Secondary | ICD-10-CM

## 2024-03-01 DIAGNOSIS — M5431 Sciatica, right side: Secondary | ICD-10-CM

## 2024-03-01 DIAGNOSIS — I10 Essential (primary) hypertension: Secondary | ICD-10-CM

## 2024-03-01 MED ORDER — INSULIN DETEMIR 100 UNIT/ML FLEXPEN: 15.0000 [IU] | PEN_INJECTOR | Freq: Every day | SUBCUTANEOUS | 2 refills | Status: DC

## 2024-03-01 MED ORDER — ROSUVASTATIN CALCIUM 10 MG PO TABS: 10.0000 mg | ORAL_TABLET | Freq: Every day | ORAL | 0 refills | Status: DC

## 2024-03-01 MED ORDER — INSULIN GLARGINE 100 UNITS/ML SOLOSTAR PEN: 15.0000 [IU] | PEN_INJECTOR | Freq: Every day | SUBCUTANEOUS | 2 refills | Status: DC

## 2024-03-01 MED ORDER — METFORMIN HCL ER 500 MG PO TB24: 1000.0000 mg | ORAL_TABLET | Freq: Every day | ORAL | 0 refills | Status: DC

## 2024-03-01 NOTE — Progress Notes (Addendum)
 Subjective:  Bailey Stevenson is a 53 y.o. female who presents for Chief Complaint  Patient presents with   Hospitalization Follow-up    Patient is here for hospital follow up and to get see if she get back on her meds.     Here for hospital follow-up.  Accompanied by boyfriend today  She went to the hospital emergency department 02/25/2024 for buttock pain, sciatica.  Last week she had buttock pain on the right and low back pain that got worse over a few days to the point she could barely walk so she went to the emergency department.  She notes that she slipped getting out of the shower back in December 2024 and fell in the right side of her body.  She thinks that some to do with her pain that flared up last week  She also had hyperglycemia likely from not being compliant with treatment for diabetes, and there was concern about major depression.  She had blood work and imaging.  Blood sugars have been running in the 200-300 range  She has not been taking her medicine for probably the past year  Last visit here 2023  No other aggravating or relieving factors.    No other c/o.  Past Medical History:  Diagnosis Date   Asthma    Diabetes mellitus    TYPE II   Elevated cholesterol    Essential hypertension, benign 10/13/2017   Pneumonia    Trichimoniasis 05/2017   Current Outpatient Medications on File Prior to Visit  Medication Sig Dispense Refill   cholecalciferol (VITAMIN D3) 25 MCG (1000 UNIT) tablet Take 1,000 Units by mouth daily. (Patient not taking: Reported on 03/01/2024)     glucose blood (GLUCOSE METER TEST) test strip 1 each by Other route as needed for other. One Touch Verio, checks 1-2 times daily (Patient not taking: Reported on 02/25/2024) 100 each 5   insulin  aspart (NOVOLOG ) 100 UNIT/ML FlexPen Inject 10 Units into the skin 3 (three) times daily with meals. (Patient not taking: Reported on 02/25/2024) 15 mL 2   Insulin  Pen Needle (BD PEN NEEDLE NANO U/F) 32G X 4 MM  MISC 1 each by Does not apply route at bedtime. (Patient not taking: Reported on 02/25/2024) 100 each 2   metFORMIN  (GLUCOPHAGE -XR) 500 MG 24 hr tablet Take 2 tablets (1,000 mg total) by mouth daily with breakfast. (Patient not taking: Reported on 02/25/2024) 180 tablet 1   No current facility-administered medications on file prior to visit.     The following portions of the patient's history were reviewed and updated as appropriate: allergies, current medications, past family history, past medical history, past social history, past surgical history and problem list.  ROS Otherwise as in subjective above  Objective: BP 136/74   Pulse 90   Ht 5\' 2"  (1.575 m)   Wt 121 lb 12.8 oz (55.2 kg)   SpO2 98%   BMI 22.28 kg/m   Wt Readings from Last 3 Encounters:  03/01/24 121 lb 12.8 oz (55.2 kg)  07/27/22 128 lb 3.2 oz (58.2 kg)  07/08/22 126 lb 12.8 oz (57.5 kg)    General appearance: alert, no distress, well developed, well nourished Neck: supple, no lymphadenopathy, no thyromegaly, no masses Heart: RRR, normal S1, S2, no murmurs Lungs: CTA bilaterally, no wheezes, rhonchi, or rales Abdomen: +bs, soft, non tender, non distended, no masses, no hepatomegaly, no splenomegaly Back nontender without deformity today MSK: Legs nontender, right hip with decreased internal range of motion otherwise  rest of legs unremarkable Pulses: 2+ radial pulses, 2+ pedal pulses, normal cap refill Ext: no edema Normal lower extremity strength and sensation, negative straight leg raise    Assessment: Encounter Diagnoses  Name Primary?   Uncontrolled type 2 diabetes mellitus with hyperglycemia (HCC) Yes   Noncompliance    Depressed mood    Dyslipidemia    Hyperlipidemia, unspecified hyperlipidemia type    Essential hypertension, benign    Sciatica of right side      Plan: I tried to reiterate how significant and risk it is to have uncontrolled diabetes.  Discussed complications of uncontrolled  diabetes.  She has had this pattern before noncompliance and not coming in for months to a year or more at a time without any really good reasoning for this  Diabetes-begin back on medication.  Start long-acting insulin  15 units daily and begin metformin  daily.  She is used mealtime insulin  before but we need to make her regimen a little easier than 3 times a day injections  Begin back on Crestor   Referral to psychiatry for counseling.  She declines medication to help with mood concerns  Sciatica has resolved  History of hypertension-blood pressure okay today.  Follow-up in 1 month    Bailey Stevenson was seen today for hospitalization follow-up.  Diagnoses and all orders for this visit:  Uncontrolled type 2 diabetes mellitus with hyperglycemia (HCC)  Noncompliance  Depressed mood -     Ambulatory referral to Psychiatry  Dyslipidemia  Hyperlipidemia, unspecified hyperlipidemia type  Essential hypertension, benign  Sciatica of right side  Other orders -     rosuvastatin  (CRESTOR ) 10 MG tablet; Take 1 tablet (10 mg total) by mouth daily. -     metFORMIN  (GLUCOPHAGE -XR) 500 MG 24 hr tablet; Take 2 tablets (1,000 mg total) by mouth daily with breakfast. -     Discontinue: insulin  detemir (LEVEMIR ) 100 UNIT/ML FlexPen; Inject 15 Units into the skin daily. -     insulin  glargine (LANTUS ) 100 unit/mL SOPN; Inject 15 Units into the skin daily.    Follow up: 60mo

## 2024-03-07 ENCOUNTER — Other Ambulatory Visit: Payer: Self-pay | Admitting: Medical

## 2024-03-07 MED ORDER — TOUJEO SOLOSTAR 300 UNIT/ML ~~LOC~~ SOPN: 15.0000 [IU] | PEN_INJECTOR | Freq: Every day | SUBCUTANEOUS | 2 refills | Status: AC

## 2024-03-07 MED ORDER — BASAGLAR KWIKPEN 100 UNIT/ML ~~LOC~~ SOPN
15.0000 [IU] | PEN_INJECTOR | Freq: Every day | SUBCUTANEOUS | 2 refills | Status: DC
Start: 2024-03-07 — End: 2024-03-29

## 2024-03-07 MED ORDER — ATORVASTATIN CALCIUM 20 MG PO TABS: 20.0000 mg | ORAL_TABLET | Freq: Every day | ORAL | 2 refills | Status: AC

## 2024-03-07 NOTE — Telephone Encounter (Signed)
 Pt will find out which is the cheapest

## 2024-03-07 NOTE — Telephone Encounter (Signed)
 Copied from CRM [id number]. Topic: General - Other >> Mar 07, 2024 11:31 AM Emylou G wrote: Reason for CRM: Pls call patient number on file. Pharmacy adv insurance won't approve rosuvastatin (CRESTOR) 10 MG tablet .Marland KitchenPls advise of alternative.Marland Kitchen

## 2024-03-23 ENCOUNTER — Other Ambulatory Visit: Payer: Self-pay | Admitting: Medical

## 2024-03-25 NOTE — Telephone Encounter (Signed)
   Pt has an appt 4/25 with Jimmye Moulds. Pt should have enough until appt

## 2024-03-29 ENCOUNTER — Ambulatory Visit: Admitting: Medical

## 2024-03-29 ENCOUNTER — Other Ambulatory Visit: Payer: Self-pay | Admitting: Medical

## 2024-03-29 VITALS — BP 130/70 | HR 88 | Wt 124.4 lb

## 2024-03-29 DIAGNOSIS — I10 Essential (primary) hypertension: Secondary | ICD-10-CM

## 2024-03-29 DIAGNOSIS — R4589 Other symptoms and signs involving emotional state: Secondary | ICD-10-CM

## 2024-03-29 DIAGNOSIS — E1165 Type 2 diabetes mellitus with hyperglycemia: Secondary | ICD-10-CM

## 2024-03-29 DIAGNOSIS — E785 Hyperlipidemia, unspecified: Secondary | ICD-10-CM

## 2024-03-29 MED ORDER — METFORMIN HCL ER 750 MG PO TB24: 1500.0000 mg | ORAL_TABLET | Freq: Every day | ORAL | 1 refills | Status: AC

## 2024-03-29 NOTE — Patient Instructions (Signed)
 Diabetes Increase to metformin  750 mg XR, 2 tablets daily.  You just completed a month of 500 mg XR 2 tabs daily Increase your Toujeo  long-acting insulin  from 15 units to 20 units starting today.  Do 20 units daily for the next week After 1 week increase to 23 units daily of Toujeo  daily Continue to monitor your blood sugars.  You can increase your insulin  to 3 units/week if your blood sugars fasting are not averaging around 130 or less Continue to monitor your blood sugars, continue to work on eating healthy food choices.  Try to eat 3 meals daily and not just 1 mL daily for example. Drink at least 80 to 100 ounces of water daily   High cholesterol Continue the atorvastatin  Lipitor 20 mg daily that you recently started back on   Vitamin D  deficiency Continue vitamin D  supplement 1000 units daily   History of depression-I recommend you try to get in with a counselor soon   Hypertension-blood pressure controlled at the moment.  We will hold off on medication for the time being

## 2024-03-29 NOTE — Progress Notes (Signed)
 Subjective:  Bailey Stevenson is a 53 y.o. female who presents for Chief Complaint  Patient presents with   Follow-up    4 week follow up .      Here for 1 mo follow up.     Last visit we got her back on medicaiton after her being noncompliant . She has been doing better since last visit.    She has started back on Metformin  XR 500mg  , 2 tablets daily     She is taking Vit D 1000 u daily  She is taking Atorvastatin  20mg  daily.  Crestor  was sent last visit but not covered by insurance.    Last visit we sent long acting basal insulin  15u, but there was some insurance issues, and she eventually was able to get Toujeo  covered by insurance.  She is using the 15u daily.    In the past week, morning fasting readings 150-215.  Last visit we referred back for counseling.   She talked with the office, but didn't feel comfortable with that office.   Prior to last visit she had went to the hospital emergency department 02/25/2024 for buttock pain, sciatica.   She notes that she slipped getting out of the shower back in December 2024 and fell in the right side of her body.  She thinks that some to do with her pain that flared up last week  She also had hyperglycemia likely from not being compliant with treatment for diabetes, and there was concern about major depression.  She had blood work and imaging.   Blood sugars had been running in the 200-300 range.  She had not been taking her medicine for probably the past year.  Last visit here 2023 prior to her visit a month ago.  No other aggravating or relieving factors.    No other c/o.  Past Medical History:  Diagnosis Date   Asthma    Diabetes mellitus    TYPE II   Elevated cholesterol    Essential hypertension, benign 10/13/2017   Pneumonia    Trichimoniasis 05/2017   Current Outpatient Medications on File Prior to Visit  Medication Sig Dispense Refill   atorvastatin  (LIPITOR) 20 MG tablet Take 1 tablet (20 mg total) by mouth daily. 90  tablet 2   cholecalciferol (VITAMIN D3) 25 MCG (1000 UNIT) tablet Take 1,000 Units by mouth daily.     glucose blood (GLUCOSE METER TEST) test strip 1 each by Other route as needed for other. One Touch Verio, checks 1-2 times daily 100 each 5   insulin  glargine, 1 Unit Dial, (TOUJEO  SOLOSTAR) 300 UNIT/ML Solostar Pen Inject 15 Units into the skin daily. 1.5 mL 2   No current facility-administered medications on file prior to visit.     The following portions of the patient's history were reviewed and updated as appropriate: allergies, current medications, past family history, past medical history, past social history, past surgical history and problem list.  ROS Otherwise as in subjective above    Objective: BP 130/70   Pulse 88   Wt 124 lb 6.4 oz (56.4 kg)   BMI 22.75 kg/m   Wt Readings from Last 3 Encounters:  03/29/24 124 lb 6.4 oz (56.4 kg)  03/01/24 121 lb 12.8 oz (55.2 kg)  07/27/22 128 lb 3.2 oz (58.2 kg)    General appearance: alert, no distress, well developed, well nourished     Assessment: Encounter Diagnoses  Name Primary?   Inadequately controlled diabetes mellitus (HCC) Yes  Dyslipidemia    Essential hypertension, benign    Depressed mood      Plan:  Diabetes Increase to metformin  750 mg XR, 2 tablets daily.  You just completed a month of 500 mg XR 2 tabs daily Increase your Toujeo  long-acting insulin  from 15 units to 20 units starting today.  Do 20 units daily for the next week After 1 week increase to 23 units daily of Toujeo  daily Continue to monitor your blood sugars.  You can increase your insulin  to 3 units/week if your blood sugars fasting are not averaging around 130 or less Continue to monitor your blood sugars, continue to work on eating healthy food choices.  Try to eat 3 meals daily and not just 1 mL daily for example. Drink at least 80 to 100 ounces of water daily   High cholesterol Continue the atorvastatin  Lipitor 20 mg daily that  you recently started back on   Vitamin D  deficiency Continue vitamin D  supplement 1000 units daily   History of depression-I recommend you try to get in with a counselor soon   Hypertension-blood pressure controlled at the moment.  We will hold off on medication for the time being   Magin was seen today for follow-up.  Diagnoses and all orders for this visit:  Inadequately controlled diabetes mellitus (HCC)  Dyslipidemia  Essential hypertension, benign  Depressed mood  Other orders -     metFORMIN  (GLUCOPHAGE -XR) 750 MG 24 hr tablet; Take 2 tablets (1,500 mg total) by mouth daily with breakfast.   Follow up: 2 to 3 months

## 2024-07-12 ENCOUNTER — Encounter: Admitting: Medical

## 2024-07-12 DIAGNOSIS — F339 Major depressive disorder, recurrent, unspecified: Secondary | ICD-10-CM | POA: Insufficient documentation

## 2024-07-12 DIAGNOSIS — E118 Type 2 diabetes mellitus with unspecified complications: Secondary | ICD-10-CM | POA: Insufficient documentation

## 2024-07-12 DIAGNOSIS — Z0000 Encounter for general adult medical examination without abnormal findings: Secondary | ICD-10-CM | POA: Insufficient documentation

## 2024-07-12 DIAGNOSIS — R948 Abnormal results of function studies of other organs and systems: Secondary | ICD-10-CM | POA: Insufficient documentation

## 2024-07-12 DIAGNOSIS — R932 Abnormal findings on diagnostic imaging of liver and biliary tract: Secondary | ICD-10-CM | POA: Insufficient documentation

## 2024-07-12 NOTE — Progress Notes (Signed)
 error

## 2024-08-14 ENCOUNTER — Ambulatory Visit (HOSPITAL_COMMUNITY)
Admission: EM | Admit: 2024-08-14 | Discharge: 2024-08-14 | Disposition: A | Discharge status: Home / Self Care | Attending: Internal Medicine | Admitting: Internal Medicine

## 2024-08-14 ENCOUNTER — Other Ambulatory Visit: Payer: Self-pay

## 2024-08-14 ENCOUNTER — Encounter (HOSPITAL_COMMUNITY): Payer: Self-pay | Admitting: Emergency Medicine

## 2024-08-14 DIAGNOSIS — K047 Periapical abscess without sinus: Secondary | ICD-10-CM | POA: Diagnosis not present

## 2024-08-14 DIAGNOSIS — L03211 Cellulitis of face: Secondary | ICD-10-CM | POA: Diagnosis not present

## 2024-08-14 DIAGNOSIS — R220 Localized swelling, mass and lump, head: Secondary | ICD-10-CM

## 2024-08-14 MED ORDER — CEFTRIAXONE SODIUM 500 MG IJ SOLR
INTRAMUSCULAR | Status: AC
  Filled 2024-08-14: qty 500

## 2024-08-14 MED ORDER — DEXAMETHASONE SODIUM PHOSPHATE 10 MG/ML IJ SOLN
INTRAMUSCULAR | Status: AC
  Filled 2024-08-14: qty 1

## 2024-08-14 MED ORDER — TRAMADOL HCL 50 MG PO TABS: 50.0000 mg | ORAL_TABLET | Freq: Four times a day (QID) | ORAL | 0 refills | Status: DC | PRN

## 2024-08-14 MED ORDER — CEFTRIAXONE SODIUM 500 MG IJ SOLR
500.0000 mg | INTRAMUSCULAR | Status: DC
  Administered 2024-08-14: 500 mg via INTRAMUSCULAR

## 2024-08-14 MED ORDER — LIDOCAINE HCL (PF) 1 % IJ SOLN
INTRAMUSCULAR | Status: AC
  Filled 2024-08-14: qty 2

## 2024-08-14 MED ORDER — AMOXICILLIN-POT CLAVULANATE 875-125 MG PO TABS: 1.0000 | ORAL_TABLET | Freq: Two times a day (BID) | ORAL | 0 refills | Status: AC

## 2024-08-14 MED ORDER — DEXAMETHASONE SODIUM PHOSPHATE 10 MG/ML IJ SOLN
10.0000 mg | Freq: Once | INTRAMUSCULAR | Status: AC
  Administered 2024-08-14: 10 mg via INTRAMUSCULAR

## 2024-08-14 NOTE — ED Provider Notes (Signed)
 [facility]    CSN: [id number] Arrival date & time: 08/14/24  1610      History   Chief Complaint Chief Complaint  Patient presents with   Facial Swelling    HPI Bailey Stevenson is a 53 y.o. female.   53 year old female presents urgent care with complaints of dental pain and facial swelling.  This started about 3 or 4 days ago.  It has gotten much worse.  She reports that she has 3 teeth that need to be extracted but has not had dental insurance.  She has not had any fevers or chills but is having a lot of facial swelling and at times swelling around the eye.  She is not having any trouble swallowing and denies any difficulty breathing.     Past Medical History:  Diagnosis Date   Asthma    Diabetes mellitus    TYPE II   Elevated cholesterol    Essential hypertension, benign 10/13/2017   Pneumonia    Trichimoniasis 05/2017    Patient Active Problem List   Diagnosis Date Noted   Type II diabetes mellitus with complication (HCC) 07/12/2024   Depression, recurrent (HCC) 07/12/2024   Encounter for health maintenance examination in adult 07/12/2024   Abnormal liver ultrasound 07/12/2024   Abnormal pancreatic function study 07/12/2024   Alkaline phosphatase elevation 07/08/2022   Dyslipidemia 06/09/2020   Other stressful life events affecting family and household 04/18/2019   High risk social situation 03/20/2019   Noncompliance 03/20/2019   Uncomplicated asthma 03/20/2019   Routine general medical examination at a health care facility 10/13/2017   Essential hypertension, benign 10/13/2017   Vaccine counseling 10/13/2017   Tachycardia 10/13/2017   Ovarian cyst, right 01/06/2016    Past Surgical History:  Procedure Laterality Date   CESAREAN SECTION     CHOLECYSTECTOMY      OB History     Gravida  1   Para  1   Term  1   Preterm      AB      Living  1      SAB      IAB      Ectopic      Multiple      Live Births  1             Home Medications    Prior to Admission medications   Medication Sig Start Date End Date Taking? Authorizing Provider  amoxicillin -clavulanate (AUGMENTIN ) 875-125 MG tablet Take 1 tablet by mouth every 12 (twelve) hours for 10 days. 08/14/24 08/24/24 Yes Jeilyn Reznik A, PA-C  traMADol  (ULTRAM ) 50 MG tablet Take 1 tablet (50 mg total) by mouth every 6 (six) hours as needed. 08/14/24  Yes Ike Maragh A, PA-C  atorvastatin  (LIPITOR) 20 MG tablet Take 1 tablet (20 mg total) by mouth daily. 03/07/24 03/07/25  Tysinger, Alm RAMAN, PA-C  cholecalciferol (VITAMIN D3) 25 MCG (1000 UNIT) tablet Take 1,000 Units by mouth daily.    [provider]  glucose blood (GLUCOSE METER TEST) test strip 1 each by Other route as needed for other. One Touch Verio, checks 1-2 times daily 07/08/22   Tysinger, Alm RAMAN, PA-C  insulin  glargine, 1 Unit Dial, (TOUJEO  SOLOSTAR) 300 UNIT/ML Solostar Pen Inject 15 Units into the skin daily. 03/07/24   Tysinger, Alm RAMAN, PA-C  metFORMIN  (GLUCOPHAGE -XR) 500 MG 24 hr tablet TAKE 2 TABLETS BY MOUTH EVERY DAY WITH BREAKFAST 03/29/24   Tysinger, Alm RAMAN, PA-C  metFORMIN  (GLUCOPHAGE -XR)  750 MG 24 hr tablet Take 2 tablets (1,500 mg total) by mouth daily with breakfast. 03/29/24   Tysinger, Alm RAMAN, PA-C    Family History Family History  Problem Relation Age of Onset   Diabetes Maternal Grandmother    Breast cancer Maternal Grandmother    Hypertension Mother    Diabetes Mother    Kidney failure Mother    Hypertension Father    Diabetes Father    Stroke Father    Hypertension Maternal Aunt    Diabetes Maternal Aunt    Cancer Maternal Aunt        throat   Hypertension Maternal Uncle    Diabetes Maternal Uncle    Hypertension Paternal Aunt    Diabetes Paternal Aunt    Hypertension Paternal Uncle    Diabetes Paternal Uncle     Social History Social History   Tobacco Use   Smoking status: Every Day    Current packs/day: 0.00    Average packs/day: 0.3 packs/day  for 9.0 years (2.3 ttl pk-yrs)    Types: Cigarettes    Start date: 09/04/2009    Last attempt to quit: 09/04/2018    Years since quitting: 5.9   Smokeless tobacco: Never  Vaping Use   Vaping status: Never Used  Substance Use Topics   Alcohol use: Never   Drug use: No     Allergies   Shellfish allergy and Egg-derived products   Review of Systems Review of Systems  Constitutional:  Negative for chills and fever.  HENT:  Positive for dental problem and facial swelling. Negative for ear pain and sore throat.   Eyes:  Negative for pain and visual disturbance.  Respiratory:  Negative for cough and shortness of breath.   Cardiovascular:  Negative for chest pain and palpitations.  Gastrointestinal:  Negative for abdominal pain and vomiting.  Genitourinary:  Negative for dysuria and hematuria.  Musculoskeletal:  Negative for arthralgias and back pain.  Skin:  Negative for color change and rash.  Neurological:  Negative for seizures and syncope.  All other systems reviewed and are negative.    Physical Exam Triage Vital Signs ED Triage Vitals  Encounter Vitals Group     BP 08/14/24 1659 (!) 173/87     Girls Systolic BP Percentile --      Girls Diastolic BP Percentile --      Boys Systolic BP Percentile --      Boys Diastolic BP Percentile --      Pulse Rate 08/14/24 1659 93     Resp 08/14/24 1659 18     Temp 08/14/24 1659 98.6 F (37 C)     Temp Source 08/14/24 1659 Oral     SpO2 08/14/24 1659 96 %     Weight --      Height --      Head Circumference --      Peak Flow --      Pain Score 08/14/24 1657 9     Pain Loc --      Pain Education --      Exclude from Growth Chart --    No data found.  Updated Vital Signs BP (!) 173/87 (BP Location: Right Arm)   Pulse 93   Temp 98.6 F (37 C) (Oral)   Resp 18   SpO2 96%   Visual Acuity Right Eye Distance:   Left Eye Distance:   Bilateral Distance:    Right Eye Near:   Left Eye Near:    Bilateral  Near:      Physical Exam Vitals and nursing note reviewed.  Constitutional:      General: She is not in acute distress.    Appearance: She is well-developed.  HENT:     Head: Normocephalic and atraumatic.   Eyes:     Conjunctiva/sclera: Conjunctivae normal.  Cardiovascular:     Rate and Rhythm: Normal rate and regular rhythm.     Heart sounds: No murmur heard. Pulmonary:     Effort: Pulmonary effort is normal. No respiratory distress.     Breath sounds: Normal breath sounds.  Abdominal:     Palpations: Abdomen is soft.     Tenderness: There is no abdominal tenderness.  Musculoskeletal:        General: No swelling.     Cervical back: Neck supple.  Skin:    General: Skin is warm and dry.     Capillary Refill: Capillary refill takes less than 2 seconds.  Neurological:     Mental Status: She is alert.  Psychiatric:        Mood and Affect: Mood normal.      UC Treatments / Results  Labs (all labs ordered are listed, but only abnormal results are displayed) Labs Reviewed - No data to display  EKG   Radiology No results found.  Procedures Procedures (including critical care time)  Medications Ordered in UC Medications  cefTRIAXone  (ROCEPHIN ) injection 500 mg (has no administration in time range)  dexamethasone  (DECADRON ) injection 10 mg (has no administration in time range)    Initial Impression / Assessment and Plan / UC Course  I have reviewed the triage vital signs and the nursing notes.  Pertinent labs & imaging results that were available during my care of the patient were reviewed by me and considered in my medical decision making (see chart for details).     Dental infection  Facial swelling  Facial cellulitis   Symptoms and physical exam findings are consistent with a dental infection with facial swelling and facial cellulitis. We will treat this aggressively given the presentation. We will treat with the following:    Ceftriaxone  injection given today.   This is an antibiotic. Decadron  injection given today. This is a steroid to help with inflammation and pain.  Monitor your blood sugars closely as this may cause a slight spike. Start 08/15/24 Augmentin  875 mg twice daily for 10 days.  This is an antibiotic.  Take this with food.  Tramadol  50 mg every 6 hours as needed for severe pain May alternate Tylenol  and ibuprofen  for mild to moderate pain Need to schedule an appointment for dental evaluation as likely further dental intervention will be needed.  Dental resource guide given today. Return to urgent care or PCP if symptoms worsen or fail to resolve.    Final Clinical Impressions(s) / UC Diagnoses   Final diagnoses:  Dental infection  Facial swelling  Facial cellulitis     Discharge Instructions      Symptoms and physical exam findings are consistent with a dental infection with facial swelling and facial cellulitis. We will treat this aggressively given the presentation. We will treat with the following:    Ceftriaxone  injection given today.  This is an antibiotic. Decadron  injection given today. This is a steroid to help with inflammation and pain.  Monitor your blood sugars closely as this may cause a slight spike. Start 08/15/24 Augmentin  875 mg twice daily for 10 days.  This is an antibiotic.  Take this with  food.  Tramadol  50 mg every 6 hours as needed for severe pain May alternate Tylenol  and ibuprofen  for mild to moderate pain Need to schedule an appointment for dental evaluation as likely further dental intervention will be needed.  Dental resource guide given today. Return to urgent care or PCP if symptoms worsen or fail to resolve.      ED Prescriptions     Medication Sig Dispense Auth. Provider   amoxicillin -clavulanate (AUGMENTIN ) 875-125 MG tablet Take 1 tablet by mouth every 12 (twelve) hours for 10 days. 20 tablet Ravi Tuccillo A, PA-C   traMADol  (ULTRAM ) 50 MG tablet Take 1 tablet (50 mg total) by mouth every  6 (six) hours as needed. 15 tablet Teresa Almarie LABOR, [place]      I have reviewed the PDMP during this encounter.   Teresa Almarie LABOR, PA-C 08/14/24 1751

## 2024-08-14 NOTE — Discharge Instructions (Addendum)
 Symptoms and physical exam findings are consistent with a dental infection with facial swelling and facial cellulitis. We will treat this aggressively given the presentation. We will treat with the following:    Ceftriaxone  injection given today.  This is an antibiotic. Decadron  injection given today. This is a steroid to help with inflammation and pain.  Monitor your blood sugars closely as this may cause a slight spike. Start 08/15/24 Augmentin  875 mg twice daily for 10 days.  This is an antibiotic.  Take this with food.  Tramadol  50 mg every 6 hours as needed for severe pain May alternate Tylenol  and ibuprofen  for mild to moderate pain Need to schedule an appointment for dental evaluation as likely further dental intervention will be needed.  Dental resource guide given today. Return to urgent care or PCP if symptoms worsen or fail to resolve.

## 2024-08-14 NOTE — ED Triage Notes (Signed)
 Facial swelling started 3 days ago.  Reports she has 3 teeth that need pulling, but unable to afford getting them pulled.

## 2024-08-16 ENCOUNTER — Ambulatory Visit (HOSPITAL_COMMUNITY): Admission: EM | Admit: 2024-08-16 | Discharge: 2024-08-16 | Disposition: A | Discharge status: Home / Self Care

## 2024-08-16 ENCOUNTER — Encounter (HOSPITAL_COMMUNITY): Payer: Self-pay

## 2024-08-16 DIAGNOSIS — T7840XA Allergy, unspecified, initial encounter: Secondary | ICD-10-CM | POA: Diagnosis not present

## 2024-08-16 DIAGNOSIS — L03211 Cellulitis of face: Secondary | ICD-10-CM

## 2024-08-16 DIAGNOSIS — K047 Periapical abscess without sinus: Secondary | ICD-10-CM

## 2024-08-16 MED ORDER — CETIRIZINE HCL 10 MG PO TABS
ORAL_TABLET | ORAL | Status: AC
  Filled 2024-08-16: qty 1

## 2024-08-16 MED ORDER — CETIRIZINE HCL 10 MG PO TABS
10.0000 mg | ORAL_TABLET | Freq: Every day | ORAL | Status: DC
  Administered 2024-08-16: 10 mg via ORAL

## 2024-08-16 MED ORDER — DICLOFENAC SODIUM 75 MG PO TBEC: 75.0000 mg | DELAYED_RELEASE_TABLET | Freq: Two times a day (BID) | ORAL | 0 refills | Status: AC

## 2024-08-16 NOTE — ED Provider Notes (Signed)
 [facility]  Note:  This document was prepared using Dragon voice recognition software and may include unintentional dictation errors.  MRN: [id number] DOB: Dec 03, 1971  Subjective:   Bailey Stevenson is a 53 y.o. female presenting for increased left facial swelling and angioedema.  Patient was seen here on Wednesday for facial swelling and dental abscess and prescribed Augmentin  and tramadol .  Patient reports that she took the first dose of both medications yesterday around 4 AM and then took a second dose of tramadol  last night before bed.  Patient reports that she woke up this morning with increased facial swelling and swelling to her left upper lip.  Patient denies any stridor, increased difficulty breathing, swelling to the throat, difficulty swallowing, wheezing.  Patient was concerned that she might be having allergic reaction to one of the medications.  Patient has taken Augmentin  several times in the past with no difficulty.  Patient reports that the first time she has ever taken tramadol .   Current Facility-Administered Medications:    cetirizine  (ZYRTEC ) tablet 10 mg, 10 mg, Oral, Daily, Janean Eischen B, NP, 10 mg at 08/16/24 1214  Current Outpatient Medications:    amoxicillin -clavulanate (AUGMENTIN ) 875-125 MG tablet, Take 1 tablet by mouth every 12 (twelve) hours for 10 days., Disp: 20 tablet, Rfl: 0   atorvastatin  (LIPITOR) 20 MG tablet, Take 1 tablet (20 mg total) by mouth daily., Disp: 90 tablet, Rfl: 2   cholecalciferol (VITAMIN D3) 25 MCG (1000 UNIT) tablet, Take 1,000 Units by mouth daily., Disp: , Rfl:    diclofenac  (VOLTAREN ) 75 MG EC tablet, Take 1 tablet (75 mg total) by mouth 2 (two) times daily., Disp: 30 tablet, Rfl: 0   glucose blood (GLUCOSE METER TEST) test strip, 1 each by Other route as needed for other. One Touch Verio, checks 1-2 times daily, Disp: 100 each, Rfl: 5   insulin  glargine, 1 Unit Dial, (TOUJEO  SOLOSTAR) 300  UNIT/ML Solostar Pen, Inject 15 Units into the skin daily., Disp: 1.5 mL, Rfl: 2   metFORMIN  (GLUCOPHAGE -XR) 750 MG 24 hr tablet, Take 2 tablets (1,500 mg total) by mouth daily with breakfast., Disp: 180 tablet, Rfl: 1   Allergies  Allergen Reactions   Shellfish Allergy Anaphylaxis, Swelling and Other (See Comments)    Throat swelling   Egg-Derived Products Nausea And Vomiting    Past Medical History:  Diagnosis Date   Asthma    Diabetes mellitus    TYPE II   Elevated cholesterol    Essential hypertension, benign 10/13/2017   Pneumonia    Trichimoniasis 05/2017     Past Surgical History:  Procedure Laterality Date   CESAREAN SECTION     CHOLECYSTECTOMY      Family History  Problem Relation Age of Onset   Diabetes Maternal Grandmother    Breast cancer Maternal Grandmother    Hypertension Mother    Diabetes Mother    Kidney failure Mother    Hypertension Father    Diabetes Father    Stroke Father    Hypertension Maternal Aunt    Diabetes Maternal Aunt    Cancer Maternal Aunt        throat   Hypertension Maternal Uncle    Diabetes Maternal Uncle    Hypertension Paternal Aunt    Diabetes Paternal Aunt    Hypertension Paternal Uncle    Diabetes Paternal Uncle     Social History   Tobacco Use   Smoking status: Every Day    Current packs/day:  0.00    Average packs/day: 0.3 packs/day for 9.0 years (2.3 ttl pk-yrs)    Types: Cigarettes    Start date: 09/04/2009    Last attempt to quit: 09/04/2018    Years since quitting: 5.9   Smokeless tobacco: Never  Vaping Use   Vaping status: Never Used  Substance Use Topics   Alcohol use: Never   Drug use: No    ROS Refer to HPI for ROS details.  Objective:    Vitals: BP 139/75 (BP Location: Left Arm)   Pulse 85   Temp 98.4 F (36.9 C) (Oral)   Resp 16   Ht 5' 2 (1.575 m)   LMP  (LMP Unknown)   SpO2 97%   BMI 22.75 kg/m   Physical Exam Vitals and nursing note reviewed.  Constitutional:      General:  She is not in acute distress.    Appearance: Normal appearance. She is well-developed. She is not ill-appearing or toxic-appearing.  HENT:     Head: Normocephalic and atraumatic.     Nose: Nose normal.     Mouth/Throat:     Mouth: Mucous membranes are moist. Angioedema present.     Dentition: Abnormal dentition. Dental tenderness, gingival swelling, dental caries and dental abscesses present.  Cardiovascular:     Rate and Rhythm: Normal rate.  Pulmonary:     Effort: Pulmonary effort is normal. No respiratory distress.  Musculoskeletal:        General: Normal range of motion.  Skin:    General: Skin is warm and dry.  Neurological:     General: No focal deficit present.     Mental Status: She is alert and oriented to person, place, and time.  Psychiatric:        Mood and Affect: Mood normal.        Behavior: Behavior normal.     Procedures  No results found for this or any previous visit (from the past 24 hours).  Assessment and Plan :     Discharge Instructions       1. Allergic reaction, initial encounter (Primary) - cetirizine  (ZYRTEC ) tablet 10 mg given in UC for acute angioedema. - Based on past history of use of Augmentin  with no previous problems, tramadol  is suspected as the potential cause of acute allergic reaction.  2. Facial cellulitis 3. Dental infection - diclofenac  (VOLTAREN ) 75 MG EC tablet; Take 1 tablet (75 mg total) by mouth 2 (two) times daily.  Dispense: 30 tablet; Refill: 0 - Discontinue use of tramadol  for pain secondary to acute allergic reaction with facial inflammation. - Follow-up with dentist as soon as possible for further evaluation and management of dental infection and possible need for tooth extraction. -Continue to monitor symptoms for any change in severity if there is any escalation of current symptoms or development of new symptoms follow-up in ER for further evaluation and management.      Gladine Plude B Jonavin Seder   Rishit Burkhalter,  [place], [place] 08/16/24 1241

## 2024-08-16 NOTE — ED Triage Notes (Signed)
 Patient was seen this past Wednesday for facial swelling due to an abscess. States she started taking Tramadol  and Amoxicillin  around 0400 yesterday and took tramadol  again last night with her diabetic medication. Woke up this morning with increased facial swelling.

## 2024-08-16 NOTE — Discharge Instructions (Signed)
  1. Allergic reaction, initial encounter (Primary) - cetirizine  (ZYRTEC ) tablet 10 mg given in UC for acute angioedema. - Based on past history of use of Augmentin  with no previous problems, tramadol  is suspected as the potential cause of acute allergic reaction.  2. Facial cellulitis 3. Dental infection - diclofenac  (VOLTAREN ) 75 MG EC tablet; Take 1 tablet (75 mg total) by mouth 2 (two) times daily.  Dispense: 30 tablet; Refill: 0 - Discontinue use of tramadol  for pain secondary to acute allergic reaction with facial inflammation. - Follow-up with dentist as soon as possible for further evaluation and management of dental infection and possible need for tooth extraction. -Continue to monitor symptoms for any change in severity if there is any escalation of current symptoms or development of new symptoms follow-up in ER for further evaluation and management.
